# Patient Record
Sex: Female | Born: 1955 | Race: Black or African American | Hispanic: No | Marital: Single | Smoking: Never smoker
Health system: Southern US, Community
[De-identification: ages and names within clinical notes are randomized; demographics above are authoritative.]

## PROBLEM LIST (undated history)

## (undated) DIAGNOSIS — I10 Essential (primary) hypertension: Secondary | ICD-10-CM

## (undated) HISTORY — PX: ABDOMINAL HYSTERECTOMY: SHX81

## (undated) HISTORY — PX: TUBAL LIGATION: SHX77

---

## 2012-11-17 ENCOUNTER — Ambulatory Visit: Payer: Self-pay | Admitting: Family Medicine

## 2014-03-22 ENCOUNTER — Ambulatory Visit: Payer: Self-pay | Admitting: Internal Medicine

## 2014-05-03 ENCOUNTER — Ambulatory Visit: Payer: Self-pay

## 2015-09-16 ENCOUNTER — Encounter: Payer: Self-pay | Admitting: Emergency Medicine

## 2015-09-16 ENCOUNTER — Emergency Department: Payer: Self-pay

## 2015-09-16 ENCOUNTER — Emergency Department
Admission: EM | Admit: 2015-09-16 | Discharge: 2015-09-16 | Disposition: A | Discharge status: Home / Self Care | Payer: Self-pay | Attending: Emergency Medicine | Admitting: Emergency Medicine

## 2015-09-16 DIAGNOSIS — R197 Diarrhea, unspecified: Secondary | ICD-10-CM | POA: Insufficient documentation

## 2015-09-16 DIAGNOSIS — E860 Dehydration: Secondary | ICD-10-CM

## 2015-09-16 DIAGNOSIS — R109 Unspecified abdominal pain: Secondary | ICD-10-CM

## 2015-09-16 DIAGNOSIS — R1110 Vomiting, unspecified: Secondary | ICD-10-CM

## 2015-09-16 DIAGNOSIS — N390 Urinary tract infection, site not specified: Secondary | ICD-10-CM

## 2015-09-16 DIAGNOSIS — E876 Hypokalemia: Secondary | ICD-10-CM

## 2015-09-16 LAB — CBC
HEMATOCRIT: 38.3 % (ref 35.0–47.0)
Hemoglobin: 12.7 g/dL (ref 12.0–16.0)
MCH: 29.4 pg (ref 26.0–34.0)
MCHC: 33.1 g/dL (ref 32.0–36.0)
MCV: 89 fL (ref 80.0–100.0)
PLATELETS: 141 10*3/uL — AB (ref 150–440)
RBC: 4.3 MIL/uL (ref 3.80–5.20)
RDW: 14.5 % (ref 11.5–14.5)
WBC: 13.5 10*3/uL — AB (ref 3.6–11.0)

## 2015-09-16 LAB — COMPREHENSIVE METABOLIC PANEL
ALBUMIN: 4.6 g/dL (ref 3.5–5.0)
ALT: 52 U/L (ref 14–54)
ANION GAP: 8 (ref 5–15)
AST: 46 U/L — AB (ref 15–41)
Alkaline Phosphatase: 71 U/L (ref 38–126)
BILIRUBIN TOTAL: 0.6 mg/dL (ref 0.3–1.2)
BUN: 15 mg/dL (ref 6–20)
CHLORIDE: 105 mmol/L (ref 101–111)
CO2: 27 mmol/L (ref 22–32)
Calcium: 10.3 mg/dL (ref 8.9–10.3)
Creatinine, Ser: 0.72 mg/dL (ref 0.44–1.00)
GFR calc Af Amer: >60 mL/min (ref >60)
GFR calc non Af Amer: >60 mL/min (ref >60)
GLUCOSE: 120 mg/dL — AB (ref 65–99)
POTASSIUM: 2.7 mmol/L — AB (ref 3.5–5.1)
SODIUM: 140 mmol/L (ref 135–145)
TOTAL PROTEIN: 8.5 g/dL — AB (ref 6.5–8.1)

## 2015-09-16 LAB — URINALYSIS COMPLETE WITH MICROSCOPIC (ARMC ONLY)
BACTERIA UA: NONE SEEN
BILIRUBIN URINE: NEGATIVE
GLUCOSE, UA: NEGATIVE mg/dL
Ketones, ur: NEGATIVE mg/dL
LEUKOCYTES UA: NEGATIVE
NITRITE: NEGATIVE
Protein, ur: NEGATIVE mg/dL
SPECIFIC GRAVITY, URINE: 1.017 (ref 1.005–1.030)
pH: 8 (ref 5.0–8.0)

## 2015-09-16 LAB — LIPASE, BLOOD: LIPASE: 22 U/L (ref 11–51)

## 2015-09-16 MED ORDER — ONDANSETRON HCL 4 MG/2ML IJ SOLN
4.0000 mg | Freq: Once | INTRAMUSCULAR | Status: AC
  Administered 2015-09-16: 4 mg via INTRAVENOUS
  Filled 2015-09-16: qty 2

## 2015-09-16 MED ORDER — DIATRIZOATE MEGLUMINE & SODIUM 66-10 % PO SOLN
15.0000 mL | Freq: Once | ORAL | Status: AC
  Administered 2015-09-16: 15 mL via ORAL
  Filled 2015-09-16: qty 30

## 2015-09-16 MED ORDER — DEXTROSE 5 % IV SOLN
1.0000 g | Freq: Once | INTRAVENOUS | Status: AC
  Administered 2015-09-16: 1 g via INTRAVENOUS
  Filled 2015-09-16: qty 10

## 2015-09-16 MED ORDER — ONDANSETRON 4 MG PO TBDP: 4.0000 mg | ORAL_TABLET | Freq: Three times a day (TID) | ORAL | Status: DC | PRN

## 2015-09-16 MED ORDER — SODIUM CHLORIDE 0.45 % IV SOLN
INTRAVENOUS | Status: DC
  Administered 2015-09-16: 07:00:00 via INTRAVENOUS
  Filled 2015-09-16 (×4): qty 1000

## 2015-09-16 MED ORDER — DICYCLOMINE HCL 10 MG PO CAPS
20.0000 mg | ORAL_CAPSULE | Freq: Once | ORAL | Status: AC
  Administered 2015-09-16: 20 mg via ORAL
  Filled 2015-09-16: qty 2

## 2015-09-16 MED ORDER — MORPHINE SULFATE (PF) 4 MG/ML IV SOLN
4.0000 mg | Freq: Once | INTRAVENOUS | Status: AC
  Administered 2015-09-16: 4 mg via INTRAVENOUS
  Filled 2015-09-16: qty 1

## 2015-09-16 MED ORDER — POTASSIUM CHLORIDE CRYS ER 20 MEQ PO TBCR
40.0000 meq | EXTENDED_RELEASE_TABLET | Freq: Once | ORAL | Status: AC
  Administered 2015-09-16: 40 meq via ORAL
  Filled 2015-09-16: qty 2

## 2015-09-16 MED ORDER — POTASSIUM CHLORIDE 10 MEQ/100ML IV SOLN
10.0000 meq | Freq: Once | INTRAVENOUS | Status: AC
  Administered 2015-09-16: 10 meq via INTRAVENOUS
  Filled 2015-09-16: qty 100

## 2015-09-16 MED ORDER — IOPAMIDOL (ISOVUE-300) INJECTION 61%
100.0000 mL | Freq: Once | INTRAVENOUS | Status: AC | PRN
  Administered 2015-09-16: 100 mL via INTRAVENOUS

## 2015-09-16 MED ORDER — CIPROFLOXACIN HCL 500 MG PO TABS: 500.0000 mg | ORAL_TABLET | Freq: Two times a day (BID) | ORAL | Status: AC

## 2015-09-16 MED ORDER — POTASSIUM CHLORIDE 10 MEQ/100ML IV SOLN
10.0000 meq | Freq: Once | INTRAVENOUS | Status: DC
  Filled 2015-09-16: qty 100

## 2015-09-16 MED ORDER — POTASSIUM CHLORIDE CRYS ER 20 MEQ PO TBCR
40.0000 meq | EXTENDED_RELEASE_TABLET | Freq: Once | ORAL | Status: DC
  Filled 2015-09-16: qty 2

## 2015-09-16 MED ORDER — DICYCLOMINE HCL 20 MG PO TABS: 20.0000 mg | ORAL_TABLET | Freq: Three times a day (TID) | ORAL | Status: DC | PRN

## 2015-09-16 NOTE — ED Notes (Signed)
Pt verbalized understanding of discharge instructions. NAD at this time. 

## 2015-09-16 NOTE — ED Provider Notes (Signed)
[facility] Emergency Department Provider Note ___________________________________________  Time seen: Approximately 7:34 AM  I have reviewed the triage vital signs and the nursing notes.   HISTORY  Chief Complaint Emesis; Abdominal Pain; and Diarrhea  HPI Kimberly French is a 60 y.o. female complaining that last night she started having multiple episodes of vomiting and abdominal cramping. Patient states that by the time she got here this morning she also had some diarrhea. Since being in the ER patient is still having a moderate amount of cramping though the vomiting is somewhat improved. Patient states that at this time her pain on scale of 0-10 is an 8. Patient states that she had the flu at the end of March, but has not been exposed to anyone with vomiting or diarrhea. Patient denies any dysuria frequency or hematuria. She also denies any fever, chills, or rash. Patient denies any blood in her emesis or stools.   History reviewed. No pertinent past medical history.  There are no active problems to display for this patient.   Past Surgical History  Procedure Laterality Date  . Abdominal hysterectomy    . Tubal ligation      Current Outpatient Rx  Name  Route  Sig  Dispense  Refill  . ciprofloxacin (CIPRO) 500 MG tablet   Oral   Take 1 tablet (500 mg total) by mouth 2 (two) times daily.   14 tablet   0   . dicyclomine (BENTYL) 20 MG tablet   Oral   Take 1 tablet (20 mg total) by mouth 3 (three) times daily as needed for spasms.   12 tablet   0   . ondansetron (ZOFRAN ODT) 4 MG disintegrating tablet   Oral   Take 1 tablet (4 mg total) by mouth every 8 (eight) hours as needed for nausea or vomiting.   15 tablet   0     Allergies Review of patient's allergies indicates no known allergies.  No family history on file.  Social History Social History  Substance Use Topics  . Smoking status: Never Smoker   . Smokeless tobacco: None  .  Alcohol Use: No    Review of Systems Constitutional: No fever/chills Eyes: No visual changes. ENT: No sore throat. Cardiovascular: Denies chest pain. Respiratory: Denies shortness of breath. Gastrointestinal: Positive for abdominal pain primarily diffuse upper.  Positive for multiple episodes of vomiting and persistent nausea.  Positive for 1-2 episodes of diarrhea.  No constipation. Genitourinary: Negative for dysuria, frequency, or hematuria. Musculoskeletal: Negative for back pain. Skin: Negative for rash. Neurological: Negative for headaches, focal weakness or numbness.  10-point ROS otherwise negative.  ____________________________________________   PHYSICAL EXAM:  VITAL SIGNS: ED Triage Vitals  Enc Vitals Group     BP 09/16/15 0531 129/76 mmHg     Pulse Rate 09/16/15 0531 81     Resp 09/16/15 0531 18     Temp 09/16/15 0531 98.5 F (36.9 C)     Temp Source 09/16/15 0531 Oral     SpO2 09/16/15 0531 100 %     Weight 09/16/15 0531 153 lb (69.4 kg)     Height 09/16/15 0531 5\' 1"  (1.549 m)     Head Cir --      Peak Flow --      Pain Score 09/16/15 0534 10     Pain Loc --      Pain Edu? --      Excl. in GC? --     Constitutional:  Alert and oriented. Well appearing and inMild acute distress. Eyes: Conjunctivae are normal. PERRL. EOMI. Head: Atraumatic. Nose: No congestion/rhinnorhea. Mouth/Throat: Mucous membranes are moist.  Oropharynx non-erythematous. Neck: No stridor.   Cardiovascular: Normal rate, regular rhythm. Grossly normal heart sounds.  Good peripheral circulation. Respiratory: Normal respiratory effort.  No retractions. Lungs CTAB. Gastrointestinal: Soft and tender to palpation diffuse upper abdomen. No distention. No abdominal bruits. No CVA tenderness. Musculoskeletal: No lower extremity tenderness nor edema.  No joint effusions. Neurologic:  Normal speech and language. No gross focal neurologic deficits are appreciated. No gait instability. Skin:   Skin is warm, dry and intact. No rash noted. Psychiatric: Mood and affect are normal. Speech and behavior are normal.  ____________________________________________   LABS (all labs ordered are listed, but only abnormal results are displayed)  Labs Reviewed  COMPREHENSIVE METABOLIC PANEL - Abnormal; Notable for the following:    Potassium 2.7 (*)    Glucose, Bld 120 (*)    Total Protein 8.5 (*)    AST 46 (*)    All other components within normal limits  CBC - Abnormal; Notable for the following:    WBC 13.5 (*)    Platelets 141 (*)    All other components within normal limits  URINALYSIS COMPLETEWITH MICROSCOPIC ([facility] ONLY) - Abnormal; Notable for the following:    Color, Urine AMBER (*)    APPearance CLEAR (*)    Hgb urine dipstick 1+ (*)    Squamous Epithelial / LPF 0-5 (*)    All other components within normal limits  LIPASE, BLOOD   ____________________________________________  EKG   ____________________________________________  RADIOLOGY Ct Abdomen Pelvis W Contrast  09/16/2015  CLINICAL DATA:  Epigastric abdominal pain, cramping and vomiting. Diarrhea. EXAM: CT ABDOMEN AND PELVIS WITH CONTRAST TECHNIQUE: Multidetector CT imaging of the abdomen and pelvis was performed using the standard protocol following bolus administration of intravenous contrast. CONTRAST:  ISOVUE-300 IOPAMIDOL (ISOVUE-300) INJECTION 61% COMPARISON:  Right upper quadrant abdominal ultrasound dated 03/22/2014. FINDINGS: Lower chest: Mild linear atelectasis or scarring at both lung bases. Hepatobiliary: Small liver cysts.  Normal appearing gallbladder. Pancreas: No mass, inflammatory changes, or other significant abnormality. Spleen: Within normal limits in size and appearance. Adrenals/Urinary Tract: Tiny cyst in each kidney. The normal appearing ureters and urinary bladder. No urinary tract calculi or hydronephrosis. Normal appearing adrenal glands. Stomach/Bowel: Multiple descending and sigmoid  colon diverticula. Mild gastric distension. No small bowel dilatation. No evidence of appendicitis. Vascular/Lymphatic: Mild atheromatous arterial calcifications. No enlarged lymph nodes. Reproductive: Surgically absent uterus.  Normal appearing ovaries. Other: Tiny umbilical hernia containing fat. Musculoskeletal:  Lumbar spine degenerative changes. IMPRESSION: 1. Mild gastric distension. 2. Extensive sigmoid and descending colon diverticulosis without evidence of diverticulitis. Electronically Signed   By: Beckie Salts M.D.   On: 09/16/2015 09:49    ____________________________________________   PROCEDURES  Procedure(s) performed: None  Critical Care performed: No  ____________________________________________   INITIAL IMPRESSION / ASSESSMENT AND PLAN / ED COURSE  Pertinent labs & imaging results that were available during my care of the patient were reviewed by me and considered in my medical decision making (see chart for details). 10:56 AM At this time will give patient some morphine and Zofran for her pain. Patient already has IV fluids running in the emergency department. Patient will get CT abdomen pelvis to evaluate. Patient's urinalysis shows 6-30 WBCs, and patient will get some IV Rocephin. Patient's potassium was 2.7, and patient will get by mouth and IV potassium at this  time.  10:56 AM Patient is much improved after all of her medications. Patient will be given an additional 40 mEq of potassium to take at home this evening, but she was given 2 runs of 10 mEq IV as well as 40 by mouth in the ER. Patient also be given some Zofran and Bentyl for her vomiting and diarrhea. CT abdomen and pelvis was essentially without any acute findings. Patient was told to return immediately if condition worsens. Patient will also be placed on Cipro for her urinary tract infection. ____________________________________________   FINAL CLINICAL IMPRESSION(S) / ED DIAGNOSES  Final diagnoses:   Acute abdominal pain  Vomiting and diarrhea  Dehydration  Hypokalemia  UTI (lower urinary tract infection)      Leona Carry, MD 09/16/15 1056

## 2015-09-16 NOTE — ED Notes (Signed)
Pt presents to ED via [facility] with c/o epigastric abd cramping with vomiting 3-4X and diarrhea X1 since around 400 this morning. Denies urinary symptoms or similar symptoms previously.

## 2016-08-31 ENCOUNTER — Emergency Department: Payer: Self-pay

## 2016-08-31 ENCOUNTER — Encounter: Payer: Self-pay | Admitting: Emergency Medicine

## 2016-08-31 ENCOUNTER — Emergency Department
Admission: EM | Admit: 2016-08-31 | Discharge: 2016-08-31 | Disposition: A | Discharge status: Home / Self Care | Payer: Self-pay | Attending: Emergency Medicine | Admitting: Emergency Medicine

## 2016-08-31 DIAGNOSIS — J069 Acute upper respiratory infection, unspecified: Secondary | ICD-10-CM

## 2016-08-31 DIAGNOSIS — J101 Influenza due to other identified influenza virus with other respiratory manifestations: Secondary | ICD-10-CM | POA: Insufficient documentation

## 2016-08-31 DIAGNOSIS — N390 Urinary tract infection, site not specified: Secondary | ICD-10-CM | POA: Insufficient documentation

## 2016-08-31 LAB — COMPREHENSIVE METABOLIC PANEL
ALK PHOS: 78 U/L (ref 38–126)
ALT: 55 U/L — AB (ref 14–54)
AST: 46 U/L — ABNORMAL HIGH (ref 15–41)
Albumin: 4.4 g/dL (ref 3.5–5.0)
Anion gap: 9 (ref 5–15)
BUN: 16 mg/dL (ref 6–20)
CHLORIDE: 104 mmol/L (ref 101–111)
CO2: 23 mmol/L (ref 22–32)
CREATININE: 0.63 mg/dL (ref 0.44–1.00)
Calcium: 9.7 mg/dL (ref 8.9–10.3)
Glucose, Bld: 86 mg/dL (ref 65–99)
POTASSIUM: 3.6 mmol/L (ref 3.5–5.1)
Sodium: 136 mmol/L (ref 135–145)
Total Bilirubin: 0.8 mg/dL (ref 0.3–1.2)
Total Protein: 8.5 g/dL — ABNORMAL HIGH (ref 6.5–8.1)

## 2016-08-31 LAB — URINALYSIS, COMPLETE (UACMP) WITH MICROSCOPIC
BACTERIA UA: NONE SEEN
Bilirubin Urine: NEGATIVE
GLUCOSE, UA: NEGATIVE mg/dL
KETONES UR: NEGATIVE mg/dL
Nitrite: NEGATIVE
PROTEIN: NEGATIVE mg/dL
Specific Gravity, Urine: 1.001 — ABNORMAL LOW (ref 1.005–1.030)
pH: 7 (ref 5.0–8.0)

## 2016-08-31 LAB — CBC
HCT: 38.4 % (ref 35.0–47.0)
Hemoglobin: 12.7 g/dL (ref 12.0–16.0)
MCH: 29.2 pg (ref 26.0–34.0)
MCHC: 33 g/dL (ref 32.0–36.0)
MCV: 88.3 fL (ref 80.0–100.0)
PLATELETS: 130 10*3/uL — AB (ref 150–440)
RBC: 4.35 MIL/uL (ref 3.80–5.20)
RDW: 14.1 % (ref 11.5–14.5)
WBC: 5.6 10*3/uL (ref 3.6–11.0)

## 2016-08-31 LAB — TROPONIN I

## 2016-08-31 LAB — LIPASE, BLOOD: Lipase: 14 U/L (ref 11–51)

## 2016-08-31 LAB — INFLUENZA PANEL BY PCR (TYPE A & B)
INFLAPCR: NEGATIVE
Influenza B By PCR: POSITIVE — AB

## 2016-08-31 LAB — PROTIME-INR
INR: 0.93
PROTHROMBIN TIME: 12.5 s (ref 11.4–15.2)

## 2016-08-31 MED ORDER — IOPAMIDOL (ISOVUE-300) INJECTION 61%
100.0000 mL | Freq: Once | INTRAVENOUS | Status: AC | PRN
  Administered 2016-08-31: 100 mL via INTRAVENOUS

## 2016-08-31 MED ORDER — OSELTAMIVIR PHOSPHATE 75 MG PO CAPS: 75.0000 mg | ORAL_CAPSULE | Freq: Two times a day (BID) | ORAL | 0 refills | Status: AC

## 2016-08-31 MED ORDER — IOPAMIDOL (ISOVUE-300) INJECTION 61%
30.0000 mL | Freq: Once | INTRAVENOUS | Status: AC | PRN
  Administered 2016-08-31: 30 mL via ORAL

## 2016-08-31 MED ORDER — OSELTAMIVIR PHOSPHATE 75 MG PO CAPS
75.0000 mg | ORAL_CAPSULE | Freq: Once | ORAL | Status: AC
  Administered 2016-08-31: 75 mg via ORAL
  Filled 2016-08-31: qty 1

## 2016-08-31 MED ORDER — SODIUM CHLORIDE 0.9 % IV BOLUS (SEPSIS)
1000.0000 mL | Freq: Once | INTRAVENOUS | Status: AC
  Administered 2016-08-31: 1000 mL via INTRAVENOUS

## 2016-08-31 MED ORDER — ONDANSETRON HCL 4 MG/2ML IJ SOLN
4.0000 mg | Freq: Once | INTRAMUSCULAR | Status: AC
  Administered 2016-08-31: 4 mg via INTRAVENOUS
  Filled 2016-08-31: qty 2

## 2016-08-31 NOTE — ED Notes (Signed)
CT notified patient finished with contrast 

## 2016-08-31 NOTE — ED Provider Notes (Signed)
[facility] Emergency Department Provider Note  Time seen: 4:35 PM  I have reviewed the triage vital signs and the nursing notes.   HISTORY  Chief Complaint Hematemesis    HPI Kimberly French is a 61 y.o. female with no past medical history, takes no medications, presents to the emergency department for upper abdominal discomfort and hematemesis. According to the patient for the past 4 days she has been expressing upper abdominal discomfort along with nausea.  Patient also states mild congestion with low-grade temperature 100.3 at home. While at work today she had 2 or 3 episodes of vomiting which contained blood. Patient states the first time she vomited was dark red blood. Denies any retching before vomiting. Patient states her last bowel movement was approximately 4 days ago but continues to pass gas. Denies any recent black or bloody stool. Denies any history of GI bleeding. Denies any blood thinner use. Currently the patient appears well rates her upper abdominal pain as a 7/10. States mild nausea.  History reviewed. No pertinent past medical history.  There are no active problems to display for this patient.   Past Surgical History:  Procedure Laterality Date  . ABDOMINAL HYSTERECTOMY    . TUBAL LIGATION      Prior to Admission medications   Medication Sig Start Date End Date Taking? Authorizing Provider  dicyclomine (BENTYL) 20 MG tablet Take 1 tablet (20 mg total) by mouth 3 (three) times daily as needed for spasms. 09/16/15 09/15/16  Leona Carry, MD  ondansetron (ZOFRAN ODT) 4 MG disintegrating tablet Take 1 tablet (4 mg total) by mouth every 8 (eight) hours as needed for nausea or vomiting. 09/16/15   Leona Carry, MD    No Known Allergies  History reviewed. No pertinent family history.  Social History Social History  Substance Use Topics  . Smoking status: Never Smoker  . Smokeless tobacco: Not on file  . Alcohol use No    Review  of Systems Constitutional: Negative for fever. Cardiovascular: Negative for chest pain. Respiratory: Negative for shortness of breath. Gastrointestinal: Upper abdominal pain. Positive for nausea with bloody vomit. Negative for diarrhea. Positive for constipation 4 days. Genitourinary: Negative for dysuria. Neurological: Negative for headache 10-point ROS otherwise negative.  ____________________________________________   PHYSICAL EXAM:  Constitutional: Alert and oriented. Well appearing and in no distress. Eyes: Normal exam ENT   Head: Normocephalic and atraumatic.   Mouth/Throat: Mucous membranes are moist. Cardiovascular: Normal rate, regular rhythm. No murmur Respiratory: Normal respiratory effort without tachypnea nor retractions. Breath sounds are clear  Gastrointestinal: Soft, mild epigastric tenderness palpation along with right and left upper quadrant tenderness. No rebound or guarding. No area of significant focal tenderness. No distention. Musculoskeletal: Nontender with normal range of motion in all extremities. Neurologic:  Normal speech and language. No gross focal neurologic deficits Skin:  Skin is warm, dry and intact.  Psychiatric: Mood and affect are normal.   ____________________________________________    EKG  EKG reviewed and interpreted by myself shows normal sinus rhythm at 86 bpm, narrow QRS, normal axis, normal intervals, nonspecific ST changes without ST elevation.  ____________________________________________    RADIOLOGY  CT scan is largely negative.  ____________________________________________   INITIAL IMPRESSION / ASSESSMENT AND PLAN / ED COURSE  Pertinent labs & imaging results that were available during my care of the patient were reviewed by me and considered in my medical decision making (see chart for details).  The patient presents emergency department 4 days of  upper abdominal discomfort now with nausea and hematemesis. We  will check labs, dose Zofran IV fluids. We'll obtain a CT scan with contrast. Overall the patient appears well, no distress, nontoxic.  Patient's labs are overall reassuring. Normal H&H. Patient does have some nonspecific ST changes on her EKG. Given her nausea and vomiting with EKG abnormality I will add on a troponin.  CT scan is largely negative. I once again discussed the amount of blood that the patient vomited. She now states that she didn't vomit but that she was coughing and spit and what she describes as a large amount of blood she states was less than a quarter mixed with sputum. Has not spit up any blood since. States she has been coughing for the past 4 or 5 days with chills but denies fever. Given this new information I do not believe the patient necessarily requires a GI examination. We will obtain a chest x-ray and influenza swab. The patient he needs to appear well with no nausea or vomiting or further spitting up in the emergency department currently 96% on room air with a normal heart rate. Anticipate likely discharge home once chest x-ray and influenza swab result. Patient is agreeable to this plan.  Pt care signed out to Dr. Cyril Loosen. ____________________________________________   FINAL CLINICAL IMPRESSION(S) / ED DIAGNOSES  Upper abdominal pain Hemoptysis Cough    Minna Antis, MD 08/31/16 2048

## 2016-08-31 NOTE — ED Triage Notes (Signed)
Patient from work ([employer]) with an acute onset of hematemesis. Patient has had nausea since Thursday with fatigue, generalized weakness, and body aches. Patient states that initial emesis was dark red with subsequent episodes being lighter.

## 2016-08-31 NOTE — ED Provider Notes (Signed)
Patient + for influenza B. Otherwise workup is reassuring. Will give tamiflu dose in ED and discharge with outpatient followup. Patient and family are comfortable with this plan.   Jene Every, MD 08/31/16 2242

## 2016-10-20 ENCOUNTER — Encounter: Payer: Self-pay | Admitting: Emergency Medicine

## 2016-10-20 ENCOUNTER — Emergency Department
Admission: EM | Admit: 2016-10-20 | Discharge: 2016-10-20 | Disposition: A | Discharge status: Home / Self Care | Payer: Self-pay | Attending: Emergency Medicine | Admitting: Emergency Medicine

## 2016-10-20 DIAGNOSIS — X509XXA Other and unspecified overexertion or strenuous movements or postures, initial encounter: Secondary | ICD-10-CM | POA: Insufficient documentation

## 2016-10-20 DIAGNOSIS — Y929 Unspecified place or not applicable: Secondary | ICD-10-CM | POA: Insufficient documentation

## 2016-10-20 DIAGNOSIS — Y9389 Activity, other specified: Secondary | ICD-10-CM | POA: Insufficient documentation

## 2016-10-20 DIAGNOSIS — S29012A Strain of muscle and tendon of back wall of thorax, initial encounter: Secondary | ICD-10-CM | POA: Insufficient documentation

## 2016-10-20 DIAGNOSIS — S29019A Strain of muscle and tendon of unspecified wall of thorax, initial encounter: Secondary | ICD-10-CM

## 2016-10-20 DIAGNOSIS — Y990 Civilian activity done for income or pay: Secondary | ICD-10-CM | POA: Insufficient documentation

## 2016-10-20 MED ORDER — NAPROXEN 500 MG PO TABS: 500.0000 mg | ORAL_TABLET | Freq: Two times a day (BID) | ORAL | 0 refills | Status: AC

## 2016-10-20 MED ORDER — CYCLOBENZAPRINE HCL 5 MG PO TABS: 5.0000 mg | ORAL_TABLET | Freq: Three times a day (TID) | ORAL | 0 refills | Status: AC | PRN

## 2016-10-20 NOTE — ED Triage Notes (Signed)
States she was pulling a large trash can at work  And felt pain to right lower back  Ambulates well to treatment area

## 2016-10-20 NOTE — ED Provider Notes (Signed)
[facility] Emergency Department Provider Note  ____________________________________________  Time seen: Approximately 1:26 PM  I have reviewed the triage vital signs and the nursing notes.   HISTORY  Chief Complaint Back Pain    HPI Kimberly French is a 61 y.o. female presents to emergency department with low left sided back pain for one day. Patient states that she was taking out the trash at work yesterday when she felt something pull in her back. She is here because she is concerned that she has a pulled muscle. She put ice on it last night, which seemed to help. Pain worsened this morning and she took Aleve.She has been walking but with pain in her lower back. Movements and bending forward makes the pain worse. She denies shortness of breath, chest pain, nausea, vomiting, bowel or bladder dysfunction, dysuria, urgency, frequency, saddle paresthesias, numbness, tingling.   History reviewed. No pertinent past medical history.  There are no active problems to display for this patient.   Past Surgical History:  Procedure Laterality Date  . ABDOMINAL HYSTERECTOMY    . TUBAL LIGATION      Prior to Admission medications   Medication Sig Start Date End Date Taking? Authorizing Provider  cyclobenzaprine (FLEXERIL) 5 MG tablet Take 1 tablet (5 mg total) by mouth 3 (three) times daily as needed for muscle spasms. 10/20/16 10/27/16  Enid DerryWagner, Arav Bannister, PA-C  dicyclomine (BENTYL) 20 MG tablet Take 1 tablet (20 mg total) by mouth 3 (three) times daily as needed for spasms. 09/16/15 09/15/16  Leona Carryaylor, Linda M, MD  naproxen (NAPROSYN) 500 MG tablet Take 1 tablet (500 mg total) by mouth 2 (two) times daily with a meal. 10/20/16 10/20/17  Enid DerryWagner, Rook Maue, PA-C  ondansetron (ZOFRAN ODT) 4 MG disintegrating tablet Take 1 tablet (4 mg total) by mouth every 8 (eight) hours as needed for nausea or vomiting. 09/16/15   Leona Carryaylor, Linda M, MD    Allergies Patient has no known  allergies.  No family history on file.  Social History Social History  Substance Use Topics  . Smoking status: Never Smoker  . Smokeless tobacco: Never Used  . Alcohol use No     Review of Systems  Constitutional: No fever/chills Cardiovascular: No chest pain. Respiratory: No SOB. Gastrointestinal: No abdominal pain.  No nausea, no vomiting.  Genitourinary: Negative for dysuria, urgency, frequency. Musculoskeletal: Positive for back pain. Skin: Negative for rash, abrasions, lacerations, ecchymosis. Neurological: Negative for headaches, numbness or tingling   ____________________________________________   PHYSICAL EXAM:  VITAL SIGNS: ED Triage Vitals  Enc Vitals Group     BP 10/20/16 1248 (!) 138/92     Pulse Rate 10/20/16 1248 79     Resp 10/20/16 1248 20     Temp 10/20/16 1248 98.7 F (37.1 C)     Temp Source 10/20/16 1248 Oral     SpO2 10/20/16 1248 99 %     Weight 10/20/16 1249 152 lb (68.9 kg)     Height 10/20/16 1249 5\' 1"  (1.549 m)     Head Circumference --      Peak Flow --      Pain Score 10/20/16 1248 6     Pain Loc --      Pain Edu? --      Excl. in GC? --      Constitutional: Alert and oriented. Well appearing and in no acute distress. Eyes: Conjunctivae are normal. PERRL. EOMI. Head: Atraumatic. ENT:      Ears:  Nose: No congestion/rhinnorhea.      Mouth/Throat: Mucous membranes are moist.  Neck: No stridor.   Cardiovascular: Normal rate, regular rhythm.  Good peripheral circulation. Respiratory: Normal respiratory effort without tachypnea or retractions. Lungs CTAB. Good air entry to the bases with no decreased or absent breath sounds. Gastrointestinal: Bowel sounds 4 quadrants. Soft and nontender to palpation. Musculoskeletal: Full range of motion to all extremities. No gross deformities appreciated. Tenderness to palpation over thoracic muscles. No tenderness to palpation over thoracic or lumbar spine. Neurologic:  Normal speech and  language. No gross focal neurologic deficits are appreciated.  Skin:  Skin is warm, dry and intact. No rash noted.   ____________________________________________   LABS (all labs ordered are listed, but only abnormal results are displayed)  Labs Reviewed - No data to display ____________________________________________  EKG   ____________________________________________  RADIOLOGY  No results found.  ____________________________________________    PROCEDURES  Procedure(s) performed:    Procedures    Medications - No data to display   ____________________________________________   INITIAL IMPRESSION / ASSESSMENT AND PLAN / ED COURSE  Pertinent labs & imaging results that were available during my care of the patient were reviewed by me and considered in my medical decision making (see chart for details).  Review of the [place] CSRS was performed in accordance of the [facility] prior to dispensing any controlled drugs.  Patient's diagnosis is consistent with back strain. Vital signs and exam are reassuring. No trauma or midline tenderness so I do not think there is indication for imaging at this time. Patient will be discharged home with prescriptions for flexeril and naprosyn. Work note was provided for light duty for 1 week. Patient is to follow up with PCP as directed. Patient is given ED precautions to return to the ED for any worsening or new symptoms.     ____________________________________________  FINAL CLINICAL IMPRESSION(S) / ED DIAGNOSES  Final diagnoses:  Strain of thoracic region, initial encounter      NEW MEDICATIONS STARTED DURING THIS VISIT:  Discharge Medication List as of 10/20/2016  2:03 PM    START taking these medications   Details  cyclobenzaprine (FLEXERIL) 5 MG tablet Take 1 tablet (5 mg total) by mouth 3 (three) times daily as needed for muscle spasms., Starting Mon 10/20/2016, Until Mon 10/27/2016, Print    naproxen (NAPROSYN) 500 MG  tablet Take 1 tablet (500 mg total) by mouth 2 (two) times daily with a meal., Starting Mon 10/20/2016, Until Tue 10/20/2017, Print            This chart was dictated using voice recognition software/Dragon. Despite best efforts to proofread, errors can occur which can change the meaning. Any change was purely unintentional.    Enid Derry, PA-C 10/20/16 1525    Jene Every, MD 10/20/16 [phone]

## 2016-10-20 NOTE — ED Notes (Signed)
Urine drug screen performed. 

## 2016-11-06 ENCOUNTER — Emergency Department
Admission: EM | Admit: 2016-11-06 | Discharge: 2016-11-06 | Disposition: A | Discharge status: Home / Self Care | Payer: Self-pay | Attending: Emergency Medicine | Admitting: Emergency Medicine

## 2016-11-06 ENCOUNTER — Emergency Department: Payer: Self-pay

## 2016-11-06 ENCOUNTER — Encounter: Payer: Self-pay | Admitting: *Deleted

## 2016-11-06 DIAGNOSIS — Y939 Activity, unspecified: Secondary | ICD-10-CM | POA: Insufficient documentation

## 2016-11-06 DIAGNOSIS — Y999 Unspecified external cause status: Secondary | ICD-10-CM | POA: Insufficient documentation

## 2016-11-06 DIAGNOSIS — N3000 Acute cystitis without hematuria: Secondary | ICD-10-CM | POA: Insufficient documentation

## 2016-11-06 DIAGNOSIS — M47896 Other spondylosis, lumbar region: Secondary | ICD-10-CM | POA: Insufficient documentation

## 2016-11-06 DIAGNOSIS — N3090 Cystitis, unspecified without hematuria: Secondary | ICD-10-CM

## 2016-11-06 DIAGNOSIS — Y929 Unspecified place or not applicable: Secondary | ICD-10-CM | POA: Insufficient documentation

## 2016-11-06 DIAGNOSIS — S39012A Strain of muscle, fascia and tendon of lower back, initial encounter: Secondary | ICD-10-CM | POA: Insufficient documentation

## 2016-11-06 DIAGNOSIS — M47816 Spondylosis without myelopathy or radiculopathy, lumbar region: Secondary | ICD-10-CM

## 2016-11-06 DIAGNOSIS — X509XXA Other and unspecified overexertion or strenuous movements or postures, initial encounter: Secondary | ICD-10-CM | POA: Insufficient documentation

## 2016-11-06 LAB — URINALYSIS, COMPLETE (UACMP) WITH MICROSCOPIC
BACTERIA UA: NONE SEEN
BILIRUBIN URINE: NEGATIVE
Glucose, UA: NEGATIVE mg/dL
KETONES UR: 5 mg/dL — AB
Nitrite: NEGATIVE
PROTEIN: 30 mg/dL — AB
Specific Gravity, Urine: 1.026 (ref 1.005–1.030)
pH: 7 (ref 5.0–8.0)

## 2016-11-06 MED ORDER — BACLOFEN 10 MG PO TABS
5.0000 mg | ORAL_TABLET | Freq: Once | ORAL | Status: AC
  Administered 2016-11-06: 5 mg via ORAL
  Filled 2016-11-06: qty 1

## 2016-11-06 MED ORDER — BACLOFEN 10 MG PO TABS: 5.0000 mg | ORAL_TABLET | Freq: Every day | ORAL | 0 refills | Status: AC

## 2016-11-06 MED ORDER — KETOROLAC TROMETHAMINE 60 MG/2ML IM SOLN
30.0000 mg | Freq: Once | INTRAMUSCULAR | Status: AC
  Administered 2016-11-06: 30 mg via INTRAMUSCULAR
  Filled 2016-11-06: qty 2

## 2016-11-06 MED ORDER — LIDOCAINE 5 % EX PTCH
1.0000 | MEDICATED_PATCH | CUTANEOUS | Status: DC
  Administered 2016-11-06: 1 via TRANSDERMAL
  Filled 2016-11-06: qty 1

## 2016-11-06 MED ORDER — CEPHALEXIN 500 MG PO CAPS: 500.0000 mg | ORAL_CAPSULE | Freq: Two times a day (BID) | ORAL | 0 refills | Status: AC

## 2016-11-06 NOTE — ED Triage Notes (Signed)
States lower back pain and spasms, states she was recently seen for the same and given flexeril, awake and alert

## 2016-11-06 NOTE — ED Notes (Signed)
Pt reports thinks she pulled a muscle in her right lower back. Pt reports was seen for this a couple of weeks ago as a WC injury. Reported had all testing completed. Pt states was feeling better and the pain started back Tuesday night and has not gotten better. Pt reports pain is constant. Pt states flexeril is not helping. Denies all other symptoms.

## 2016-11-06 NOTE — ED Provider Notes (Signed)
[facility] Emergency Department Provider Note  ____________________________________________  Time seen: Approximately 11:03 AM  I have reviewed the triage vital signs and the nursing notes.   HISTORY  Chief Complaint Back Pain    HPI Kimberly French is a 61 y.o. female that presents to emergency department with low back pain. Patient states that she was evaluated 2 weeks ago for similar symptoms. She was taking Aleve and a muscle relaxer which resolved symptoms for a week. Pain began hurting again 2 days ago. She continued to work and pain has continued to get worse. Pain is primarily in her low back on the right side.Pain does not radiate. She lifts patients at work all day. She took a muscle relaxer this morning but it did not help. She denies headache, fever, shortness of breath, chest pain, nausea, vomiting, abdominal pain, bowel or bladder dysfunction, saddle paresthesias, numbness, tingling, dysuria, urgency, frequency, leg pain.   History reviewed. No pertinent past medical history.  There are no active problems to display for this patient.   Past Surgical History:  Procedure Laterality Date  . ABDOMINAL HYSTERECTOMY    . TUBAL LIGATION      Prior to Admission medications   Medication Sig Start Date End Date Taking? Authorizing Provider  baclofen (LIORESAL) 10 MG tablet Take 0.5 tablets (5 mg total) by mouth daily. 11/06/16 11/06/17  Enid Derry, PA-C  cephALEXin (KEFLEX) 500 MG capsule Take 1 capsule (500 mg total) by mouth 2 (two) times daily. 11/06/16 11/16/16  Enid Derry, PA-C  dicyclomine (BENTYL) 20 MG tablet Take 1 tablet (20 mg total) by mouth 3 (three) times daily as needed for spasms. 09/16/15 09/15/16  Leona Carry, MD  naproxen (NAPROSYN) 500 MG tablet Take 1 tablet (500 mg total) by mouth 2 (two) times daily with a meal. 10/20/16 10/20/17  Enid Derry, PA-C  ondansetron (ZOFRAN ODT) 4 MG disintegrating tablet Take 1 tablet (4 mg  total) by mouth every 8 (eight) hours as needed for nausea or vomiting. 09/16/15   Leona Carry, MD    Allergies Patient has no known allergies.  History reviewed. No pertinent family history.  Social History Social History  Substance Use Topics  . Smoking status: Never Smoker  . Smokeless tobacco: Never Used  . Alcohol use No     Review of Systems  Constitutional: No fever/chills Cardiovascular: No chest pain. Respiratory:  No SOB. Gastrointestinal: No abdominal pain.  No nausea, no vomiting.  Musculoskeletal: Positive for low back pain. Skin: Negative for rash, abrasions, lacerations, ecchymosis. Neurological: Negative for headaches, numbness or tingling   ____________________________________________   PHYSICAL EXAM:  VITAL SIGNS: ED Triage Vitals  Enc Vitals Group     BP 11/06/16 0951 123/79     Pulse Rate 11/06/16 0951 90     Resp 11/06/16 0951 18     Temp 11/06/16 0951 97.8 F (36.6 C)     Temp Source 11/06/16 0951 Oral     SpO2 11/06/16 0951 98 %     Weight 11/06/16 0947 152 lb (68.9 kg)     Height 11/06/16 0947 5\' 1"  (1.549 m)     Head Circumference --      Peak Flow --      Pain Score 11/06/16 0947 10     Pain Loc --      Pain Edu? --      Excl. in GC? --      Constitutional: Alert and oriented. Well appearing and in  no acute distress. Eyes: Conjunctivae are normal. PERRL. EOMI. Head: Atraumatic. ENT:      Ears:      Nose: No congestion/rhinnorhea.      Mouth/Throat: Mucous membranes are moist.  Neck: No stridor.  Cardiovascular: Normal rate, regular rhythm.  Good peripheral circulation. Respiratory: Normal respiratory effort without tachypnea or retractions. Lungs CTAB. Good air entry to the bases with no decreased or absent breath sounds. Gastrointestinal: Bowel sounds 4 quadrants. Soft and nontender to palpation. No guarding or rigidity. No palpable masses. No distention. No CVA tenderness.  Musculoskeletal: Full range of motion to all  extremities. No gross deformities appreciated. Tenderness to palpation over thoracic and midline spine. Tenderness to palpation over right lumbar muscles.  Neurologic:  Normal speech and language. No gross focal neurologic deficits are appreciated.  Skin:  Skin is warm, dry and intact. No rash noted.  ____________________________________________   LABS (all labs ordered are listed, but only abnormal results are displayed)  Labs Reviewed  URINALYSIS, COMPLETE (UACMP) WITH MICROSCOPIC - Abnormal; Notable for the following:       Result Value   Color, Urine YELLOW (*)    APPearance CLOUDY (*)    Hgb urine dipstick SMALL (*)    Ketones, ur 5 (*)    Protein, ur 30 (*)    Leukocytes, UA LARGE (*)    Squamous Epithelial / LPF 6-30 (*)    All other components within normal limits   ____________________________________________  EKG   ____________________________________________  RADIOLOGY Lexine Baton, personally viewed and evaluated these images (plain radiographs) as part of my medical decision making, as well as reviewing the written report by the radiologist.  Dg Thoracic Spine 2 View  Result Date: 11/06/2016 CLINICAL DATA:  Low back pain.  No known injury. EXAM: THORACIC SPINE 2 VIEWS COMPARISON:  None. FINDINGS: Normal alignment. No fracture. Early spurring in the mid and lower thoracic spine. IMPRESSION: No acute bony abnormality. Electronically Signed   By: Charlett Nose M.D.   On: 11/06/2016 11:18   Dg Lumbar Spine Complete  Result Date: 11/06/2016 CLINICAL DATA:  Low back pain, onset 2 days ago.  No known injury. EXAM: LUMBAR SPINE - COMPLETE 4+ VIEW COMPARISON:  CT 08/31/2016 FINDINGS: Degenerative facet disease throughout the lumbar spine. Disc spaces are maintained. No fracture or subluxation. SI joints are symmetric and unremarkable. Scattered aortic calcifications. IMPRESSION: Degenerative facet disease diffusely throughout the lumbar spine. No acute bony abnormality.  Electronically Signed   By: Charlett Nose M.D.   On: 11/06/2016 11:17   Ct Renal Stone Study  Result Date: 11/06/2016 CLINICAL DATA:  RIGHT side low back pain, initially thought to be up old muscle but not getting better, constant pain since Tuesday night, no relief with Flexeril EXAM: CT ABDOMEN AND PELVIS WITHOUT CONTRAST TECHNIQUE: Multidetector CT imaging of the abdomen and pelvis was performed following the standard protocol without IV contrast. Sagittal and coronal MPR images reconstructed from axial data set. Oral contrast not administered for this indication. COMPARISON:  08/31/2016 FINDINGS: Lower chest: Minimal linear subsegmental atelectasis at both lung bases. Hepatobiliary: Gallbladder and liver normal appearance Pancreas: Normal appearance Spleen: Normal appearance Adrenals/Urinary Tract: Adrenal glands normal appearance. Kidneys, ureters, and bladder normal appearance for technique. No urinary tract calcification or dilatation. Stomach/Bowel: Normal appendix. Diverticulosis of descending and sigmoid colon without evidence of diverticulitis. Small hiatal hernia. Stomach and bowel loops otherwise normal appearance. Vascular/Lymphatic: Atherosclerotic calcification aorta without aneurysm. Circumaortic LEFT renal vein. Few pelvic phleboliths. No adenopathy. Reproductive:  Uterus surgically absent.  Ovaries unremarkable. Other: No free air or free fluid. Tiny umbilical hernia containing fat. Musculoskeletal: Mild degenerative disc and facet disease changes of the lumbar spine. IMPRESSION: No evidence of urinary tract calcification or dilatation. Distal colonic diverticulosis without evidence of diverticulitis. Tiny umbilical hernia containing fat. Aortic atherosclerosis. Electronically Signed   By: Ulyses SouthwardMark  Boles M.D.   On: 11/06/2016 12:53    ____________________________________________    PROCEDURES  Procedure(s) performed:    Procedures    Medications  lidocaine (LIDODERM) 5 % 1 patch (1  patch Transdermal Patch Applied 11/06/16 1114)  ketorolac (TORADOL) injection 30 mg (30 mg Intramuscular Given 11/06/16 1141)  baclofen (LIORESAL) tablet 5 mg (5 mg Oral Given 11/06/16 1345)     ____________________________________________   INITIAL IMPRESSION / ASSESSMENT AND PLAN / ED COURSE  Pertinent labs & imaging results that were available during my care of the patient were reviewed by me and considered in my medical decision making (see chart for details).  Review of the [place] CSRS was performed in accordance of the [facility] prior to dispensing any controlled drugs.   Patient's diagnosis is consistent with cystitis, lumbar strain, osteoarthritis. Vital signs and exam are reassuring. No acute bony abnormalities of thoracic or lumbar x-ray. Lumbar x-ray indicates osteoarthritis. Urinalysis shows leukocytes so CT renal stone study was ordered. No indication of kidney stone on CT. No abdominal pain. Patient states that she felt significantly better after Toradol shot. Patient will be discharged home with prescriptions for Keflex and baclofen. Patient is to follow up with PCP as directed. Patient is given ED precautions to return to the ED for any worsening or new symptoms.     ____________________________________________  FINAL CLINICAL IMPRESSION(S) / ED DIAGNOSES  Final diagnoses:  Cystitis  Strain of lumbar region, initial encounter  Spondylosis of lumbar region without myelopathy or radiculopathy      NEW MEDICATIONS STARTED DURING THIS VISIT:  Discharge Medication List as of 11/06/2016  1:33 PM    START taking these medications   Details  baclofen (LIORESAL) 10 MG tablet Take 0.5 tablets (5 mg total) by mouth daily., Starting Thu 11/06/2016, Until Fri 11/06/2017, Print    cephALEXin (KEFLEX) 500 MG capsule Take 1 capsule (500 mg total) by mouth 2 (two) times daily., Starting Thu 11/06/2016, Until Sun 11/16/2016, Print            This chart was dictated using voice recognition  software/Dragon. Despite best efforts to proofread, errors can occur which can change the meaning. Any change was purely unintentional.    Enid DerryWagner, Chalisa Kobler, PA-C 11/06/16 1442    Sharman CheekStafford, Phillip, MD 11/08/16 2022

## 2017-06-14 ENCOUNTER — Other Ambulatory Visit: Payer: Self-pay

## 2017-06-14 ENCOUNTER — Emergency Department
Admission: EM | Admit: 2017-06-14 | Discharge: 2017-06-14 | Disposition: A | Discharge status: Home / Self Care | Payer: Self-pay | Attending: Emergency Medicine | Admitting: Emergency Medicine

## 2017-06-14 ENCOUNTER — Encounter: Payer: Self-pay | Admitting: Emergency Medicine

## 2017-06-14 DIAGNOSIS — Z79899 Other long term (current) drug therapy: Secondary | ICD-10-CM | POA: Insufficient documentation

## 2017-06-14 DIAGNOSIS — J069 Acute upper respiratory infection, unspecified: Secondary | ICD-10-CM | POA: Insufficient documentation

## 2017-06-14 MED ORDER — PREDNISONE 10 MG PO TABS: ORAL_TABLET | ORAL | 0 refills | Status: DC

## 2017-06-14 MED ORDER — IPRATROPIUM-ALBUTEROL 0.5-2.5 (3) MG/3ML IN SOLN
3.0000 mL | Freq: Once | RESPIRATORY_TRACT | Status: AC
  Administered 2017-06-14: 3 mL via RESPIRATORY_TRACT
  Filled 2017-06-14: qty 3

## 2017-06-14 MED ORDER — BENZONATATE 100 MG PO CAPS: 100.0000 mg | ORAL_CAPSULE | Freq: Three times a day (TID) | ORAL | 0 refills | Status: AC | PRN

## 2017-06-14 MED ORDER — ALBUTEROL SULFATE HFA 108 (90 BASE) MCG/ACT IN AERS: 2.0000 | INHALATION_SPRAY | Freq: Four times a day (QID) | RESPIRATORY_TRACT | 0 refills | Status: AC | PRN

## 2017-06-14 MED ORDER — AZITHROMYCIN 250 MG PO TABS: ORAL_TABLET | ORAL | 0 refills | Status: DC

## 2017-06-14 NOTE — ED Provider Notes (Signed)
[facility] Emergency Department Provider Note  ____________________________________________  Time seen: Approximately 9:13 AM  I have reviewed the triage vital signs and the nursing notes.   HISTORY  Chief Complaint Cough and Sore Throat    HPI Kimberly French is a 62 y.o. female that presents to the emergency department for evaluation of chest congestion and productive cough with clear phelem for 5 days.  Cough was originally keeping patient up at night but this has been improving.  She does not think she has had a fever but has had chills.  She does not smoke.  No history of allergies or asthma.  Several family members have bronchitis.  No nasal congestion, sore throat, shortness of breath, chest pain, nausea, vomiting, abdominal pain.  History reviewed. No pertinent past medical history.  There are no active problems to display for this patient.   Past Surgical History:  Procedure Laterality Date  . ABDOMINAL HYSTERECTOMY    . TUBAL LIGATION      Prior to Admission medications   Medication Sig Start Date End Date Taking? Authorizing Provider  albuterol (PROVENTIL HFA;VENTOLIN HFA) 108 (90 Base) MCG/ACT inhaler Inhale 2 puffs into the lungs every 6 (six) hours as needed for wheezing or shortness of breath. 06/14/17   Enid DerryWagner, Kedarius Aloisi, PA-C  azithromycin (ZITHROMAX Z-PAK) 250 MG tablet Take 2 tablets (500 mg) on  Day 1,  followed by 1 tablet (250 mg) once daily on Days 2 through 5. 06/14/17   Enid DerryWagner, Niaomi Cartaya, PA-C  baclofen (LIORESAL) 10 MG tablet Take 0.5 tablets (5 mg total) by mouth daily. 11/06/16 11/06/17  Enid DerryWagner, Reyce Lubeck, PA-C  benzonatate (TESSALON PERLES) 100 MG capsule Take 1 capsule (100 mg total) by mouth 3 (three) times daily as needed for cough. 06/14/17 06/14/18  Enid DerryWagner, Ayris Carano, PA-C  dicyclomine (BENTYL) 20 MG tablet Take 1 tablet (20 mg total) by mouth 3 (three) times daily as needed for spasms. 09/16/15 09/15/16  Leona Carryaylor, Linda M, MD  naproxen  (NAPROSYN) 500 MG tablet Take 1 tablet (500 mg total) by mouth 2 (two) times daily with a meal. 10/20/16 10/20/17  Enid DerryWagner, Joshua Zeringue, PA-C  ondansetron (ZOFRAN ODT) 4 MG disintegrating tablet Take 1 tablet (4 mg total) by mouth every 8 (eight) hours as needed for nausea or vomiting. 09/16/15   Leona Carryaylor, Linda M, MD  predniSONE (DELTASONE) 10 MG tablet Take 6 tablets on day 1, take 5 tablets on day 2, take 4 tablets on day 3, take 3 tablets on day 4, take 2 tablets on day 5, take 1 tablet on day 6 06/14/17   Enid DerryWagner, Hutson Luft, PA-C    Allergies Patient has no known allergies.  No family history on file.  Social History Social History   Tobacco Use  . Smoking status: Never Smoker  . Smokeless tobacco: Never Used  Substance Use Topics  . Alcohol use: No  . Drug use: No     Review of Systems  Constitutional: No fever/chills Eyes: No visual changes. No discharge. ENT: Positive for congestion and rhinorrhea. Cardiovascular: No chest pain. Respiratory: Positive for cough. No SOB. Gastrointestinal: No abdominal pain.  No nausea, no vomiting.  No diarrhea.  No constipation. Musculoskeletal: Negative for musculoskeletal pain. Skin: Negative for rash, abrasions, lacerations, ecchymosis.   ____________________________________________   PHYSICAL EXAM:  VITAL SIGNS: ED Triage Vitals  Enc Vitals Group     BP 06/14/17 0904 130/83     Pulse Rate 06/14/17 0904 81     Resp 06/14/17 0904 18  Temp 06/14/17 0904 98.3 F (36.8 C)     Temp Source 06/14/17 0904 Oral     SpO2 06/14/17 0904 98 %     Weight 06/14/17 0902 154 lb (69.9 kg)     Height 06/14/17 0902 5\' 1"  (1.549 m)     Head Circumference --      Peak Flow --      Pain Score 06/14/17 0902 10     Pain Loc --      Pain Edu? --      Excl. in GC? --      Constitutional: Alert and oriented. Well appearing and in no acute distress. Eyes: Conjunctivae are normal. PERRL. EOMI. No discharge. Head: Atraumatic. ENT: No frontal and maxillary  sinus tenderness.      Ears: Tympanic membranes pearly gray with good landmarks. No discharge.      Nose: Mild congestion/rhinnorhea.      Mouth/Throat: Mucous membranes are moist. Oropharynx non-erythematous. Tonsils not enlarged. No exudates. Uvula midline. Neck: No stridor.   Hematological/Lymphatic/Immunilogical: No cervical lymphadenopathy. Cardiovascular: Normal rate, regular rhythm.  Good peripheral circulation. Respiratory: Normal respiratory effort without tachypnea or retractions. Lungs CTAB. Good air entry to the bases with no decreased or absent breath sounds. Gastrointestinal: Bowel sounds 4 quadrants. Soft and nontender to palpation. No guarding or rigidity. No palpable masses. No distention. Musculoskeletal: Full range of motion to all extremities. No gross deformities appreciated. Neurologic:  Normal speech and language. No gross focal neurologic deficits are appreciated.  Skin:  Skin is warm, dry and intact. No rash noted.    ____________________________________________   LABS (all labs ordered are listed, but only abnormal results are displayed)  Labs Reviewed - No data to display ____________________________________________  EKG   ____________________________________________  RADIOLOGY   No results found.  ____________________________________________    PROCEDURES  Procedure(s) performed:    Procedures    Medications  ipratropium-albuterol (DUONEB) 0.5-2.5 (3) MG/3ML nebulizer solution 3 mL (3 mLs Nebulization Given 06/14/17 0932)     ____________________________________________   INITIAL IMPRESSION / ASSESSMENT AND PLAN / ED COURSE  Pertinent labs & imaging results that were available during my care of the patient were reviewed by me and considered in my medical decision making (see chart for details).  Review of the [place] CSRS was performed in accordance of the [facility] prior to dispensing any controlled drugs.   Patient's diagnosis is  consistent with URI with cough. Vital signs and exam are reassuring.  We discussed doing a chest x-ray and agreed it would unlikely change management at this time.  Patient appears well and is staying well hydrated. Patient should alternate tylenol and ibuprofen for fever. Patient feels comfortable going home. She was given a Duoneb. Patient will be discharged home with prescriptions for azithromycin, prednisone, albuterol inhaler, tessalon pearles. Patient is to follow up with PCP as needed or otherwise directed. Patient is given ED precautions to return to the ED for any worsening or new symptoms.     ____________________________________________  FINAL CLINICAL IMPRESSION(S) / ED DIAGNOSES  Final diagnoses:  URI with cough and congestion      NEW MEDICATIONS STARTED DURING THIS VISIT:  ED Discharge Orders        Ordered    azithromycin (ZITHROMAX Z-PAK) 250 MG tablet     06/14/17 0945    predniSONE (DELTASONE) 10 MG tablet     06/14/17 0945    benzonatate (TESSALON PERLES) 100 MG capsule  3 times daily PRN  06/14/17 0945    albuterol (PROVENTIL HFA;VENTOLIN HFA) 108 (90 Base) MCG/ACT inhaler  Every 6 hours PRN     06/14/17 0945          This chart was dictated using voice recognition software/Dragon. Despite best efforts to proofread, errors can occur which can change the meaning. Any change was purely unintentional.    Enid Derry, PA-C 06/14/17 1115    Dionne Bucy, MD 06/14/17 [phone]

## 2017-06-14 NOTE — ED Triage Notes (Signed)
Pt c/o cough, sore throat, and congestion since last Tuesday. Pt in NAD at this time.

## 2017-09-21 ENCOUNTER — Emergency Department
Admission: EM | Admit: 2017-09-21 | Discharge: 2017-09-21 | Disposition: A | Discharge status: Home / Self Care | Payer: Self-pay | Attending: Emergency Medicine | Admitting: Emergency Medicine

## 2017-09-21 ENCOUNTER — Encounter: Payer: Self-pay | Admitting: Emergency Medicine

## 2017-09-21 DIAGNOSIS — R0981 Nasal congestion: Secondary | ICD-10-CM | POA: Insufficient documentation

## 2017-09-21 DIAGNOSIS — J029 Acute pharyngitis, unspecified: Secondary | ICD-10-CM | POA: Insufficient documentation

## 2017-09-21 DIAGNOSIS — M25561 Pain in right knee: Secondary | ICD-10-CM | POA: Insufficient documentation

## 2017-09-21 LAB — GROUP A STREP BY PCR: Group A Strep by PCR: NOT DETECTED

## 2017-09-21 MED ORDER — MELOXICAM 7.5 MG PO TABS: 7.5000 mg | ORAL_TABLET | Freq: Every day | ORAL | 0 refills | Status: DC

## 2017-09-21 MED ORDER — PSEUDOEPH-BROMPHEN-DM 30-2-10 MG/5ML PO SYRP: 5.0000 mL | ORAL_SOLUTION | Freq: Four times a day (QID) | ORAL | 0 refills | Status: DC | PRN

## 2017-09-21 MED ORDER — LIDOCAINE VISCOUS 2 % MT SOLN: 5.0000 mL | Freq: Four times a day (QID) | OROMUCOSAL | 0 refills | Status: DC | PRN

## 2017-09-21 NOTE — ED Provider Notes (Signed)
[facility] Emergency Department Provider Note   ____________________________________________   First MD Initiated Contact with Patient 09/21/17 0809     (approximate)  I have reviewed the triage vital signs and the nursing notes.   HISTORY  Chief Complaint Sore Throat; Nasal Congestion; and Knee Pain    HPI Kimberly French is a 62 y.o. female patient complained of sore throat and nasal congestion for 4 days.  Patient also state increased pain in right knee.  Patient states no relief taking Aleve and sinus medications.  Patient has a history of osteoarthritis of bilateral knees.  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  Patient rates her pain as a 10/10.  Patient described pain is "aching".  No palliative measures for sore throat.   History reviewed. No pertinent past medical history.  There are no active problems to display for this patient.   Past Surgical History:  Procedure Laterality Date  . ABDOMINAL HYSTERECTOMY    . TUBAL LIGATION      Prior to Admission medications   Medication Sig Start Date End Date Taking? Authorizing Provider  albuterol (PROVENTIL HFA;VENTOLIN HFA) 108 (90 Base) MCG/ACT inhaler Inhale 2 puffs into the lungs every 6 (six) hours as needed for wheezing or shortness of breath. 06/14/17   Enid Derry, PA-C  azithromycin (ZITHROMAX Z-PAK) 250 MG tablet Take 2 tablets (500 mg) on  Day 1,  followed by 1 tablet (250 mg) once daily on Days 2 through 5. 06/14/17   Enid Derry, PA-C  baclofen (LIORESAL) 10 MG tablet Take 0.5 tablets (5 mg total) by mouth daily. 11/06/16 11/06/17  Enid Derry, PA-C  benzonatate (TESSALON PERLES) 100 MG capsule Take 1 capsule (100 mg total) by mouth 3 (three) times daily as needed for cough. 06/14/17 06/14/18  Enid Derry, PA-C  brompheniramine-pseudoephedrine-DM 30-2-10 MG/5ML syrup Take 5 mLs by mouth 4 (four) times daily as needed. Mix with  5 mL of viscous lidocaine for swish and swallow. 09/21/17   Joni Reining, PA-C  dicyclomine (BENTYL) 20 MG tablet Take 1 tablet (20 mg total) by mouth 3 (three) times daily as needed for spasms. 09/16/15 09/15/16  Leona Carry, MD  lidocaine (XYLOCAINE) 2 % solution Use as directed 5 mLs in the mouth or throat every 6 (six) hours as needed for mouth pain. Mix with 5 mL of Bromfed-DM for swish and swallow. 09/21/17   Joni Reining, PA-C  meloxicam (MOBIC) 7.5 MG tablet Take 1 tablet (7.5 mg total) by mouth daily. 09/21/17   Joni Reining, PA-C  naproxen (NAPROSYN) 500 MG tablet Take 1 tablet (500 mg total) by mouth 2 (two) times daily with a meal. 10/20/16 10/20/17  Enid Derry, PA-C  ondansetron (ZOFRAN ODT) 4 MG disintegrating tablet Take 1 tablet (4 mg total) by mouth every 8 (eight) hours as needed for nausea or vomiting. 09/16/15   Leona Carry, MD  predniSONE (DELTASONE) 10 MG tablet Take 6 tablets on day 1, take 5 tablets on day 2, take 4 tablets on day 3, take 3 tablets on day 4, take 2 tablets on day 5, take 1 tablet on day 6 06/14/17   Enid Derry, PA-C    Allergies Patient has no known allergies.  No family history on file.  Social History Social History   Tobacco Use  . Smoking status: Never Smoker  . Smokeless tobacco: Never Used  Substance Use Topics  . Alcohol use:  No  . Drug use: No    Review of Systems Constitutional: No fever/chills Eyes: No visual changes. ENT: Sore throat Cardiovascular: Denies chest pain. Respiratory: Denies shortness of breath. Gastrointestinal: No abdominal pain.  No nausea, no vomiting.  No diarrhea.  No constipation. Genitourinary: Negative for dysuria. Musculoskeletal: Right knee pain.   Skin: Negative for rash. Neurological: Negative for headaches, focal weakness or numbness.   ____________________________________________   PHYSICAL EXAM:  VITAL SIGNS: ED Triage Vitals  Enc Vitals Group     BP 09/21/17 0751 126/80      Pulse Rate 09/21/17 0751 92     Resp 09/21/17 0751 17     Temp 09/21/17 0751 99 F (37.2 C)     Temp Source 09/21/17 0751 Oral     SpO2 09/21/17 0751 98 %     Weight 09/21/17 0752 150 lb (68 kg)     Height 09/21/17 0752 5\' 1"  (1.549 m)     Head Circumference --      Peak Flow --      Pain Score 09/21/17 0752 10     Pain Loc --      Pain Edu? --      Excl. in [place]? --    Constitutional: Alert and oriented. Well appearing and in no acute distress. Nose: Edematous nasal turbinates clear rhinorrhea. Mouth/Throat: Mucous membranes are moist.  Oropharynx non-erythematous. Neck: No stridor. Hematological/Lymphatic/Immunilogical: No cervical lymphadenopathy. Cardiovascular: Normal rate, regular rhythm. Grossly normal heart sounds.  Good peripheral circulation. Respiratory: Normal respiratory effort.  No retractions. Lungs CTAB. Musculoskeletal: No obvious deformity to the right knee.  Patient has full neck range of motion.  Moderate crepitus palpation anterior patella. Neurologic:  Normal speech and language. No gross focal neurologic deficits are appreciated. No gait instability. Skin:  Skin is warm, dry and intact. No rash noted. Psychiatric: Mood and affect are normal. Speech and behavior are normal.  ____________________________________________   LABS (all labs ordered are listed, but only abnormal results are displayed)  Labs Reviewed  GROUP A STREP BY PCR   ____________________________________________  EKG   ____________________________________________  RADIOLOGY  ED MD interpretation:    Official radiology report(s): No results found.  ____________________________________________   PROCEDURES  Procedure(s) performed: None  Procedures  Critical Care performed: No  ____________________________________________   INITIAL IMPRESSION / ASSESSMENT AND PLAN / ED COURSE  As part of my medical decision making, I reviewed the following data within the electronic  MEDICAL RECORD NUMBER    Patient presents with sore throat and nasal congestion for 4 days.  Patient also complained of increasing right knee pain secondary to arthritis.  Patient strep test was negative.  Patient given discharge care instruction advised take medication as directed.  Patient advised to follow-up PCP for continued care.      ____________________________________________   FINAL CLINICAL IMPRESSION(S) / ED DIAGNOSES  Final diagnoses:  Pharyngitis, unspecified etiology  Acute pain of right knee     ED Discharge Orders        Ordered    lidocaine (XYLOCAINE) 2 % solution  Every 6 hours PRN     09/21/17 0946    brompheniramine-pseudoephedrine-DM 30-2-10 MG/5ML syrup  4 times daily PRN     09/21/17 0946    meloxicam (MOBIC) 7.5 MG tablet  Daily     09/21/17 0946       Note:  This document was prepared using Dragon voice recognition software and may include unintentional dictation errors.    Nona DellSmith, Ronald  K, PA-C 09/21/17 1610    Arnaldo Natal, MD 09/21/17 1432

## 2017-09-21 NOTE — ED Triage Notes (Signed)
Pt reports sore throat x4 days, nasal congestion and pain in right knee. Pt has been taking aleve and sinus medication without relief.

## 2017-09-21 NOTE — ED Notes (Signed)
Pt with red, sore throat. S/s of URI. NAD

## 2017-12-28 ENCOUNTER — Emergency Department
Admission: EM | Admit: 2017-12-28 | Discharge: 2017-12-28 | Disposition: A | Discharge status: Home / Self Care | Payer: Self-pay | Attending: Emergency Medicine | Admitting: Emergency Medicine

## 2017-12-28 ENCOUNTER — Emergency Department: Payer: Self-pay

## 2017-12-28 ENCOUNTER — Encounter: Payer: Self-pay | Admitting: Emergency Medicine

## 2017-12-28 DIAGNOSIS — M170 Bilateral primary osteoarthritis of knee: Secondary | ICD-10-CM | POA: Insufficient documentation

## 2017-12-28 DIAGNOSIS — Z79899 Other long term (current) drug therapy: Secondary | ICD-10-CM | POA: Insufficient documentation

## 2017-12-28 MED ORDER — NAPROXEN 500 MG PO TABS: 500.0000 mg | ORAL_TABLET | Freq: Two times a day (BID) | ORAL | Status: DC

## 2017-12-28 NOTE — ED Notes (Signed)
See triage note  Presents with pain to left knee  Denies any injury  Min swelling note to knee  Ambulates well to treatment room

## 2017-12-28 NOTE — ED Provider Notes (Signed)
[facility] Emergency Department Provider Note   ____________________________________________   None    (approximate)  I have reviewed the triage vital signs and the nursing notes.   HISTORY  Chief Complaint Knee Pain    HPI Kimberly French is a 62 y.o. female patient complain of bilateral knee pain left greater than right.  Patient state pain for over 2 years.  Patient state she has been told was arthritis but no definitive diagnosis.  Patient state refractory to over-the-counter anti-inflammatory medications.  Patient rates the pain a 7/10.  Patient described the pain is "achy".  History reviewed. No pertinent past medical history.  There are no active problems to display for this patient.   Past Surgical History:  Procedure Laterality Date  . ABDOMINAL HYSTERECTOMY    . TUBAL LIGATION      Prior to Admission medications   Medication Sig Start Date End Date Taking? Authorizing Provider  albuterol (PROVENTIL HFA;VENTOLIN HFA) 108 (90 Base) MCG/ACT inhaler Inhale 2 puffs into the lungs every 6 (six) hours as needed for wheezing or shortness of breath. 06/14/17   Enid Derry, PA-C  azithromycin (ZITHROMAX Z-PAK) 250 MG tablet Take 2 tablets (500 mg) on  Day 1,  followed by 1 tablet (250 mg) once daily on Days 2 through 5. 06/14/17   Enid Derry, PA-C  benzonatate (TESSALON PERLES) 100 MG capsule Take 1 capsule (100 mg total) by mouth 3 (three) times daily as needed for cough. 06/14/17 06/14/18  Enid Derry, PA-C  brompheniramine-pseudoephedrine-DM 30-2-10 MG/5ML syrup Take 5 mLs by mouth 4 (four) times daily as needed. Mix with 5 mL of viscous lidocaine for swish and swallow. 09/21/17   Joni Reining, PA-C  dicyclomine (BENTYL) 20 MG tablet Take 1 tablet (20 mg total) by mouth 3 (three) times daily as needed for spasms. 09/16/15 09/15/16  Leona Carry, MD  lidocaine (XYLOCAINE) 2 % solution Use as directed 5 mLs in the mouth or throat every 6  (six) hours as needed for mouth pain. Mix with 5 mL of Bromfed-DM for swish and swallow. 09/21/17   Joni Reining, PA-C  meloxicam (MOBIC) 7.5 MG tablet Take 1 tablet (7.5 mg total) by mouth daily. 09/21/17   Joni Reining, PA-C  naproxen (NAPROSYN) 500 MG tablet Take 1 tablet (500 mg total) by mouth 2 (two) times daily with a meal. 12/28/17   Joni Reining, PA-C  ondansetron (ZOFRAN ODT) 4 MG disintegrating tablet Take 1 tablet (4 mg total) by mouth every 8 (eight) hours as needed for nausea or vomiting. 09/16/15   Leona Carry, MD  predniSONE (DELTASONE) 10 MG tablet Take 6 tablets on day 1, take 5 tablets on day 2, take 4 tablets on day 3, take 3 tablets on day 4, take 2 tablets on day 5, take 1 tablet on day 6 06/14/17   Enid Derry, PA-C    Allergies Patient has no known allergies.  No family history on file.  Social History Social History   Tobacco Use  . Smoking status: Never Smoker  . Smokeless tobacco: Never Used  Substance Use Topics  . Alcohol use: No  . Drug use: No    Review of Systems  Constitutional: No fever/chills Eyes: No visual changes. ENT: No sore throat. Cardiovascular: Denies chest pain. Respiratory: Denies shortness of breath. Gastrointestinal: No abdominal pain.  No nausea, no vomiting.  No diarrhea.  No constipation. Genitourinary: Negative for dysuria. Musculoskeletal: Chronic bilateral knee pain. Skin:  Negative for rash. Neurological: Negative for headaches, focal weakness or numbness.   ____________________________________________   PHYSICAL EXAM:  VITAL SIGNS: ED Triage Vitals  Enc Vitals Group     BP 12/28/17 1118 (!) 129/92     Pulse Rate 12/28/17 1118 95     Resp 12/28/17 1118 16     Temp 12/28/17 1118 98.4 F (36.9 C)     Temp Source 12/28/17 1118 Oral     SpO2 12/28/17 1118 99 %     Weight 12/28/17 1122 150 lb (68 kg)     Height 12/28/17 1122 5\' 1"  (1.549 m)     Head Circumference --      Peak Flow --      Pain Score  12/28/17 1121 7     Pain Loc --      Pain Edu? --      Excl. in [place]? --     Constitutional: Alert and oriented. Well appearing and in no acute distress. Cardiovascular: Normal rate, regular rhythm. Grossly normal heart sounds.  Good peripheral circulation. Respiratory: Normal respiratory effort.  No retractions. Lungs CTAB. Musculoskeletal: No obvious deformity of bilateral knees.  Moderate to palpation of the left knee.  Patient has full/ equal range of motion.  No obvious joint effusion. Neurologic:  Normal speech and language. No gross focal neurologic deficits are appreciated. No gait instability. Skin:  Skin is warm, dry and intact. No rash noted. Psychiatric: Mood and affect are normal. Speech and behavior are normal.  ____________________________________________   LABS (all labs ordered are listed, but only abnormal results are displayed)  Labs Reviewed - No data to display ____________________________________________  EKG   ____________________________________________  RADIOLOGY  ED MD interpretation:    Official radiology report(s): Dg Knee Complete 4 Views Left  Result Date: 12/28/2017 CLINICAL DATA:  The knee pain for while. EXAM: LEFT KNEE - COMPLETE 4+ VIEW COMPARISON:  None in PACs FINDINGS: The bones are subjectively adequately mineralized. There is mild narrowing of the medial joint compartment. There is faint chondrocalcinosis of the medial meniscus. The lateral and patellofemoral joint spaces are well maintained. There is mild beaking of the tibial spines. There is no acute or healing fracture. There is no joint effusion. IMPRESSION: Mild osteoarthritic joint space loss and meniscal calcification of the medial compartment. No acute bony abnormality. Electronically Signed   By: David  SwazilandJordan M.D.   On: 12/28/2017 12:09    ____________________________________________   PROCEDURES  Procedure(s) performed: None  Procedures  Critical Care performed:  No  ____________________________________________   INITIAL IMPRESSION / ASSESSMENT AND PLAN / ED COURSE  As part of my medical decision making, I reviewed the following data within the electronic MEDICAL RECORD NUMBER    Left knee pain secondary to osteoarthritis.  Discussed x-ray findings with patient.  Patient given discharge care instruction.  Patient advised to follow orthopedic for definitive evaluation and treatment.      ____________________________________________   FINAL CLINICAL IMPRESSION(S) / ED DIAGNOSES  Final diagnoses:  Primary osteoarthritis of both knees     ED Discharge Orders        Ordered    naproxen (NAPROSYN) 500 MG tablet  2 times daily with meals     12/28/17 1228       Note:  This document was prepared using Dragon voice recognition software and may include unintentional dictation errors.    Joni ReiningSmith, Michaeljohn Biss K, PA-C 12/28/17 1230    Schaevitz, Myra Rudeavid Matthew, MD 12/28/17 1524

## 2017-12-28 NOTE — ED Triage Notes (Signed)
Pt ambulates with a steady gait to triage room. Pt arrives with complaints of knee pain. Pt states "I've had problems with me knees for awhile, ive been here and they said it was arthritis but I don't think it's arthritis." Pt denies any relief with OTC medication. Pt denies any new injury.

## 2017-12-28 NOTE — Discharge Instructions (Signed)
Call and schedule appointment with orthopedic clinic for definitive evaluation and treatment.

## 2019-06-27 ENCOUNTER — Other Ambulatory Visit: Payer: Self-pay | Admitting: Certified Nurse Midwife

## 2019-06-27 DIAGNOSIS — Z1231 Encounter for screening mammogram for malignant neoplasm of breast: Secondary | ICD-10-CM

## 2019-07-11 ENCOUNTER — Other Ambulatory Visit: Payer: Self-pay

## 2019-07-11 ENCOUNTER — Emergency Department
Admission: EM | Admit: 2019-07-11 | Discharge: 2019-07-11 | Disposition: A | Discharge status: Home / Self Care | Payer: No Typology Code available for payment source | Attending: Emergency Medicine | Admitting: Emergency Medicine

## 2019-07-11 DIAGNOSIS — T464X5A Adverse effect of angiotensin-converting-enzyme inhibitors, initial encounter: Secondary | ICD-10-CM | POA: Diagnosis not present

## 2019-07-11 DIAGNOSIS — R220 Localized swelling, mass and lump, head: Secondary | ICD-10-CM | POA: Insufficient documentation

## 2019-07-11 DIAGNOSIS — I10 Essential (primary) hypertension: Secondary | ICD-10-CM | POA: Diagnosis not present

## 2019-07-11 DIAGNOSIS — T783XXA Angioneurotic edema, initial encounter: Secondary | ICD-10-CM

## 2019-07-11 HISTORY — DX: Essential (primary) hypertension: I10

## 2019-07-11 MED ORDER — EPINEPHRINE 0.3 MG/0.3ML IJ SOAJ
INTRAMUSCULAR | Status: DC
Start: 2019-07-11 — End: 2019-07-12
  Filled 2019-07-11: qty 0.3

## 2019-07-11 MED ORDER — METHYLPREDNISOLONE SODIUM SUCC 125 MG IJ SOLR
125.0000 mg | Freq: Once | INTRAMUSCULAR | Status: AC
Start: 2019-07-11 — End: 2019-07-11
  Administered 2019-07-11: 125 mg via INTRAVENOUS
  Filled 2019-07-11: qty 2

## 2019-07-11 MED ORDER — EPINEPHRINE 0.3 MG/0.3ML IJ SOAJ: 0.3000 mg | INTRAMUSCULAR | 0 refills | Status: AC | PRN

## 2019-07-11 MED ORDER — DIPHENHYDRAMINE HCL 50 MG/ML IJ SOLN
25.0000 mg | Freq: Once | INTRAMUSCULAR | Status: AC
  Administered 2019-07-11: 25 mg via INTRAVENOUS
  Filled 2019-07-11: qty 1

## 2019-07-11 MED ORDER — FAMOTIDINE IN NACL 20-0.9 MG/50ML-% IV SOLN
20.0000 mg | Freq: Once | INTRAVENOUS | Status: AC
  Administered 2019-07-11: 20 mg via INTRAVENOUS
  Filled 2019-07-11: qty 50

## 2019-07-11 MED ORDER — FAMOTIDINE 20 MG PO TABS: 20.0000 mg | ORAL_TABLET | Freq: Two times a day (BID) | ORAL | 0 refills | Status: AC

## 2019-07-11 MED ORDER — PREDNISONE 20 MG PO TABS: 20.0000 mg | ORAL_TABLET | Freq: Every day | ORAL | 0 refills | Status: AC

## 2019-07-11 MED ORDER — DIPHENHYDRAMINE HCL 50 MG/ML IJ SOLN: 25.0000 mg | Freq: Once | INTRAMUSCULAR | Status: DC

## 2019-07-11 MED ORDER — DIPHENHYDRAMINE HCL 50 MG PO TABS: ORAL_TABLET | ORAL | 0 refills | Status: AC

## 2019-07-11 NOTE — Discharge Instructions (Signed)
Stop taking the benazepril immediately.  You should tell all doctors that you are allergic to this medication and all ACE inhibitors.  For your swelling, I have prescribed 3 days of prednisone, a steroid, as well as Benadryl and Pepcid.  The Benadryl and Pepcid can be bought over-the-counter as well.  The swelling should improve over the next 24 to 48 hours.  If you notice the swelling to worsen, as well as any new or worsening swelling in your tongue, throat, or sensation like you are having difficulty breathing, return to the ER immediately.  I have also prescribed an EpiPen.  While your reaction today is technically not an allergic reaction, given that you have had some lip swelling, it is a good idea to keep this on you and to give if you develop worsening oral swelling or other signs of severe anaphylaxis or allergic reaction, as indicated on the EpiPen.

## 2019-07-11 NOTE — ED Provider Notes (Signed)
[facility] Emergency Department Provider Note  ____________________________________________   First MD Initiated Contact with Patient 07/11/19 1911     (approximate)  I have reviewed the triage vital signs and the nursing notes.   HISTORY  Chief Complaint  Angioedema    HPI Kimberly French is a 64 y.o. female  Here with lip swelling. Pt states that earlier today, she noticed that her right upper lip felt a little numb or "odd." She was eating at the time and thought it was related to that or something getting on her lip. Since then, she's had progressively worsening R upper lip edema and swelling. It progressed fairly rapidly but has been somewhat constant over past few hr. No tongue swelling. No fullness sensation in her throat or neck. No difficulty speaking, swallowing, or breathing. No other complaints. No urticaria. No new allergen exposures. No new food exposures or lipstick/cosmetics.  Of note, pt recently restarted Benazepril several weeks ago.        Past Medical History:  Diagnosis Date  . Hypertension     There are no problems to display for this patient.   Past Surgical History:  Procedure Laterality Date  . ABDOMINAL HYSTERECTOMY    . TUBAL LIGATION      Prior to Admission medications   Medication Sig Start Date End Date Taking? Authorizing Provider  albuterol (PROVENTIL HFA;VENTOLIN HFA) 108 (90 Base) MCG/ACT inhaler Inhale 2 puffs into the lungs every 6 (six) hours as needed for wheezing or shortness of breath. 06/14/17   Enid Derry, PA-C  diphenhydrAMINE (BENADRYL) 50 MG tablet Take 1 tablet every 6 hours for 24 hours, then every 6 hours PRN for itching or swelling of lips 07/11/19   Shaune Pollack, MD  EPINEPHrine 0.3 mg/0.3 mL IJ SOAJ injection Inject 0.3 mLs (0.3 mg total) into the muscle as needed for anaphylaxis. 07/11/19   Shaune Pollack, MD  famotidine (PEPCID) 20 MG tablet Take 1 tablet (20 mg total) by mouth 2 (two)  times daily for 3 days. 07/11/19 07/14/19  Shaune Pollack, MD  predniSONE (DELTASONE) 20 MG tablet Take 1 tablet (20 mg total) by mouth daily for 3 days. 07/11/19 07/14/19  Shaune Pollack, MD    Allergies Benazepril  No family history on file.  Social History Social History   Tobacco Use  . Smoking status: Never Smoker  . Smokeless tobacco: Never Used  Substance Use Topics  . Alcohol use: No  . Drug use: No    Review of Systems  Review of Systems  Constitutional: Negative for chills and fever.  HENT: Positive for facial swelling. Negative for congestion, drooling, rhinorrhea, sinus pain, sore throat, trouble swallowing and voice change.   Respiratory: Negative for shortness of breath.   Cardiovascular: Negative for chest pain.  Gastrointestinal: Negative for abdominal pain.  Genitourinary: Negative for flank pain.  Musculoskeletal: Negative for neck pain.  Skin: Negative for rash and wound.  Allergic/Immunologic: Negative for immunocompromised state.  Neurological: Negative for weakness and numbness.  Hematological: Does not bruise/bleed easily.     ____________________________________________  PHYSICAL EXAM:      VITAL SIGNS: ED Triage Vitals  Enc Vitals Group     BP 07/11/19 1914 (!) 145/81     Pulse Rate 07/11/19 1914 71     Resp 07/11/19 1914 18     Temp 07/11/19 1914 99.2 F (37.3 C)     Temp Source 07/11/19 1914 Oral     SpO2 07/11/19 1914 100 %  Weight 07/11/19 1915 156 lb (70.8 kg)     Height 07/11/19 1915 5\' 1"  (1.549 m)     Head Circumference --      Peak Flow --      Pain Score 07/11/19 1915 0     Pain Loc --      Pain Edu? --      Excl. in [place]? --      Physical Exam Vitals and nursing note reviewed.  Constitutional:      General: She is not in acute distress.    Appearance: She is well-developed.  HENT:     Head: Normocephalic and atraumatic.     Comments: Significant edema of right upper lip extending just across midline. No open wounds.  No tongue/lingual swelling, posterior OP clear. No submandibular or sublingual swelling. Eyes:     Conjunctiva/sclera: Conjunctivae normal.  Neck:     Comments: No stridor Cardiovascular:     Rate and Rhythm: Normal rate and regular rhythm.     Heart sounds: Normal heart sounds.  Pulmonary:     Effort: Pulmonary effort is normal. No respiratory distress.     Breath sounds: No wheezing.  Abdominal:     General: There is no distension.  Musculoskeletal:     Cervical back: Neck supple.  Skin:    General: Skin is warm.     Capillary Refill: Capillary refill takes less than 2 seconds.     Findings: No rash.     Comments: No uriticaria  Neurological:     Mental Status: She is alert and oriented to person, place, and time.     Motor: No abnormal muscle tone.       ____________________________________________   LABS (all labs ordered are listed, but only abnormal results are displayed)  Labs Reviewed - No data to display  ____________________________________________  EKG: Normal sinus rhythm, VR 65. QRS 95, QTc 399. RSR', no acute ST elevations or depressions. No acute ischemia or infarct. ________________________________________  RADIOLOGY All imaging, including plain films, CT scans, and ultrasounds, independently reviewed by me, and interpretations confirmed via formal radiology reads.  ED MD interpretation:   None  Official radiology report(s): No results found.  ____________________________________________  PROCEDURES   Procedure(s) performed (including Critical Care):  Procedures  ____________________________________________  INITIAL IMPRESSION / MDM / [place] / ED COURSE  As part of my medical decision making, I reviewed the following data within the [place] notes reviewed and incorporated, Old chart reviewed, Notes from prior ED visits, and [place] Controlled Substance Database       *Kimberly French was  evaluated in Emergency Department on 07/12/2019 for the symptoms described in the history of present illness. She was evaluated in the context of the global COVID-19 pandemic, which necessitated consideration that the patient might be at risk for infection with the SARS-CoV-2 virus that causes COVID-19. Institutional protocols and algorithms that pertain to the evaluation of patients at risk for COVID-19 are in a state of rapid change based on information released by regulatory bodies including the CDC and federal and state organizations. These policies and algorithms were followed during the patient's care in the ED.  Some ED evaluations and interventions may be delayed as a result of limited staffing during the pandemic.*  Clinical Course as of Jul 12 39  Mon Jul 11, 2019  2154 64 yo F here with likely ACE induced angioedema. Monitored x >4 hr with no progression of edema/swelling, and she  has no tongue, posterior pharyngeal, tonsillar, uvular, or signs of hypopharyngeal or epiglottic/airway involvement. This began after starting Benazepril and she has no urticaria, GI sx, or other signs of allergic sx with no known recent triggers. Will treat with steroids and antihistamines in event of allergic rxn, have her stop benazepril (added to Allergy list) and call her PCP tomorrow to discuss new BP meds.   [CI]    Clinical Course User Index [CI] Shaune Pollack, MD    Medical Decision Making:  As above.  ____________________________________________  FINAL CLINICAL IMPRESSION(S) / ED DIAGNOSES  Final diagnoses:  Angioedema due to angiotensin converting enzyme inhibitor (ACE-I)     MEDICATIONS GIVEN DURING THIS VISIT:  Medications  EPINEPHrine (EPI-PEN) 0.3 mg/0.3 mL injection (has no administration in time range)  famotidine (PEPCID) IVPB 20 mg premix (0 mg Intravenous Stopped 07/11/19 2002)  methylPREDNISolone sodium succinate (SOLU-MEDROL) 125 mg/2 mL injection 125 mg (125 mg Intravenous Given  07/11/19 1918)  diphenhydrAMINE (BENADRYL) injection 25 mg (25 mg Intravenous Given 07/11/19 2328)     ED Discharge Orders         Ordered    predniSONE (DELTASONE) 20 MG tablet  Daily     07/11/19 2211    diphenhydrAMINE (BENADRYL) 50 MG tablet     07/11/19 2211    famotidine (PEPCID) 20 MG tablet  2 times daily     07/11/19 2211    EPINEPHrine 0.3 mg/0.3 mL IJ SOAJ injection  As needed     07/11/19 2211           Note:  This document was prepared using Dragon voice recognition software and may include unintentional dictation errors.   Shaune Pollack, MD 07/12/19 [phone]

## 2019-07-11 NOTE — ED Triage Notes (Signed)
PT to ED via EMS c/o angioedema since this AM.  No trouble breathing, lung fields clear per EMS. PT took 2 benadryls prior to EMS arrival. AO and speaking in full sentences

## 2019-07-11 NOTE — ED Notes (Signed)
Pt talking to family on phone  

## 2019-07-11 NOTE — ED Notes (Signed)
Pt up to bathroom with assistance.  No resp distress.  Pt using phone to call family

## 2019-07-11 NOTE — ED Notes (Signed)
Dr Erma Heritage in with pt now.  Pt alert.  No acute resp distress.

## 2019-07-11 NOTE — ED Notes (Signed)
Pt's right upper lip still swollen   No resp distress.  Pt alert.

## 2020-06-27 ENCOUNTER — Other Ambulatory Visit: Payer: Self-pay | Admitting: Certified Nurse Midwife

## 2020-06-27 DIAGNOSIS — Z1231 Encounter for screening mammogram for malignant neoplasm of breast: Secondary | ICD-10-CM

## 2020-07-18 ENCOUNTER — Other Ambulatory Visit: Payer: Self-pay

## 2020-07-18 ENCOUNTER — Ambulatory Visit
Admission: RE | Admit: 2020-07-18 | Discharge: 2020-07-18 | Disposition: A | Discharge status: Home / Self Care | Payer: Medicare HMO | Source: Ambulatory Visit | Attending: Certified Nurse Midwife | Admitting: Certified Nurse Midwife

## 2020-07-18 DIAGNOSIS — Z1231 Encounter for screening mammogram for malignant neoplasm of breast: Secondary | ICD-10-CM | POA: Insufficient documentation

## 2020-07-25 DIAGNOSIS — M25562 Pain in left knee: Secondary | ICD-10-CM | POA: Diagnosis not present

## 2020-07-25 DIAGNOSIS — Z23 Encounter for immunization: Secondary | ICD-10-CM | POA: Diagnosis not present

## 2020-07-25 DIAGNOSIS — Z0000 Encounter for general adult medical examination without abnormal findings: Secondary | ICD-10-CM | POA: Diagnosis not present

## 2020-07-25 DIAGNOSIS — E119 Type 2 diabetes mellitus without complications: Secondary | ICD-10-CM | POA: Diagnosis not present

## 2020-07-25 DIAGNOSIS — I10 Essential (primary) hypertension: Secondary | ICD-10-CM | POA: Diagnosis not present

## 2020-07-25 DIAGNOSIS — Z780 Asymptomatic menopausal state: Secondary | ICD-10-CM | POA: Diagnosis not present

## 2020-08-03 DIAGNOSIS — M8588 Other specified disorders of bone density and structure, other site: Secondary | ICD-10-CM | POA: Diagnosis not present

## 2020-08-14 DIAGNOSIS — H9202 Otalgia, left ear: Secondary | ICD-10-CM | POA: Diagnosis not present

## 2020-08-14 DIAGNOSIS — R0981 Nasal congestion: Secondary | ICD-10-CM | POA: Diagnosis not present

## 2020-08-14 DIAGNOSIS — Z20822 Contact with and (suspected) exposure to covid-19: Secondary | ICD-10-CM | POA: Diagnosis not present

## 2020-08-20 DIAGNOSIS — R197 Diarrhea, unspecified: Secondary | ICD-10-CM | POA: Diagnosis not present

## 2020-08-23 ENCOUNTER — Emergency Department: Payer: Medicare HMO

## 2020-08-23 ENCOUNTER — Emergency Department
Admission: EM | Admit: 2020-08-23 | Discharge: 2020-08-23 | Disposition: A | Discharge status: Home / Self Care | Payer: Medicare HMO | Attending: Emergency Medicine | Admitting: Emergency Medicine

## 2020-08-23 ENCOUNTER — Other Ambulatory Visit: Payer: Self-pay

## 2020-08-23 DIAGNOSIS — M10061 Idiopathic gout, right knee: Secondary | ICD-10-CM | POA: Insufficient documentation

## 2020-08-23 DIAGNOSIS — M25562 Pain in left knee: Secondary | ICD-10-CM | POA: Diagnosis not present

## 2020-08-23 DIAGNOSIS — I10 Essential (primary) hypertension: Secondary | ICD-10-CM | POA: Diagnosis not present

## 2020-08-23 DIAGNOSIS — E876 Hypokalemia: Secondary | ICD-10-CM | POA: Insufficient documentation

## 2020-08-23 DIAGNOSIS — M1711 Unilateral primary osteoarthritis, right knee: Secondary | ICD-10-CM | POA: Diagnosis not present

## 2020-08-23 DIAGNOSIS — M25561 Pain in right knee: Secondary | ICD-10-CM | POA: Diagnosis not present

## 2020-08-23 LAB — CBC WITH DIFFERENTIAL/PLATELET
Abs Immature Granulocytes: 0.05 10*3/uL (ref 0.00–0.07)
Basophils Absolute: 0.1 10*3/uL (ref 0.0–0.1)
Basophils Relative: 1 %
Eosinophils Absolute: 0.1 10*3/uL (ref 0.0–0.5)
Eosinophils Relative: 1 %
HCT: 41.6 % (ref 36.0–46.0)
Hemoglobin: 13.9 g/dL (ref 12.0–15.0)
Immature Granulocytes: 0 %
Lymphocytes Relative: 14 %
Lymphs Abs: 1.8 10*3/uL (ref 0.7–4.0)
MCH: 29.4 pg (ref 26.0–34.0)
MCHC: 33.4 g/dL (ref 30.0–36.0)
MCV: 87.9 fL (ref 80.0–100.0)
Monocytes Absolute: 1.3 10*3/uL — ABNORMAL HIGH (ref 0.1–1.0)
Monocytes Relative: 11 %
Neutro Abs: 9.2 10*3/uL — ABNORMAL HIGH (ref 1.7–7.7)
Neutrophils Relative %: 73 %
Platelets: 183 10*3/uL (ref 150–400)
RBC: 4.73 MIL/uL (ref 3.87–5.11)
RDW: 13.4 % (ref 11.5–15.5)
WBC: 12.5 10*3/uL — ABNORMAL HIGH (ref 4.0–10.5)
nRBC: 0 % (ref 0.0–0.2)

## 2020-08-23 LAB — URIC ACID: Uric Acid, Serum: 8.8 mg/dL — ABNORMAL HIGH (ref 2.5–7.1)

## 2020-08-23 LAB — BASIC METABOLIC PANEL
Anion gap: 11 (ref 5–15)
BUN: 21 mg/dL (ref 8–23)
CO2: 25 mmol/L (ref 22–32)
Calcium: 9.7 mg/dL (ref 8.9–10.3)
Chloride: 102 mmol/L (ref 98–111)
Creatinine, Ser: 0.78 mg/dL (ref 0.44–1.00)
GFR, Estimated: >60 mL/min (ref >60)
Glucose, Bld: 106 mg/dL — ABNORMAL HIGH (ref 70–99)
Potassium: 3 mmol/L — ABNORMAL LOW (ref 3.5–5.1)
Sodium: 138 mmol/L (ref 135–145)

## 2020-08-23 MED ORDER — COLCHICINE 0.6 MG PO TABS: 0.6000 mg | ORAL_TABLET | Freq: Every day | ORAL | 0 refills | Status: AC

## 2020-08-23 MED ORDER — POTASSIUM CHLORIDE ER 10 MEQ PO TBCR: 10.0000 meq | EXTENDED_RELEASE_TABLET | Freq: Every day | ORAL | 0 refills | Status: AC

## 2020-08-23 MED ORDER — LIDOCAINE 5 % EX PTCH
1.0000 | MEDICATED_PATCH | CUTANEOUS | Status: DC
  Administered 2020-08-23: 1 via TRANSDERMAL
  Filled 2020-08-23: qty 1

## 2020-08-23 MED ORDER — METHYLPREDNISOLONE 4 MG PO TBPK: ORAL_TABLET | ORAL | 0 refills | Status: AC

## 2020-08-23 MED ORDER — KETOROLAC TROMETHAMINE 30 MG/ML IJ SOLN
30.0000 mg | Freq: Once | INTRAMUSCULAR | Status: AC
  Administered 2020-08-23: 30 mg via INTRAMUSCULAR
  Filled 2020-08-23: qty 1

## 2020-08-23 NOTE — Discharge Instructions (Signed)
Read and follow discharge care instruction.  Follow-up PCP in 7 to 10 days.

## 2020-08-23 NOTE — ED Provider Notes (Signed)
[facility] Emergency Department Provider Note  . ____________________________________________   Event Date/Time   First MD Initiated Contact with Patient 08/23/20 1001     (approximate)  I have reviewed the triage vital signs and the nursing notes.   HISTORY  Chief Complaint Knee Pain  .  HPI Kimberly French is a 65 y.o. female Patient present for right knee pain and edema. States history of bilateral knee pain, but this feels different.No provocative incident for compliant. Pain increased with ambulation. Rates as 5/10. Described pain as "achy". No palliative measure for compliant.        . Past Medical History:  Diagnosis Date  . Hypertension     There are no problems to display for this patient.   Past Surgical History:  Procedure Laterality Date  . ABDOMINAL HYSTERECTOMY    . TUBAL LIGATION      Prior to Admission medications   Medication Sig Start Date End Date Taking? Authorizing Provider  colchicine 0.6 MG tablet Take 1 tablet (0.6 mg total) by mouth daily. 08/23/20 08/23/21 Yes Joni Reining, PA-C  methylPREDNISolone (MEDROL DOSEPAK) 4 MG TBPK tablet Take Tapered dose as directed 08/23/20  Yes Joni Reining, PA-C  potassium chloride (KLOR-CON) 10 MEQ tablet Take 1 tablet (10 mEq total) by mouth daily. 08/23/20  Yes Joni Reining, PA-C  albuterol (PROVENTIL HFA;VENTOLIN HFA) 108 (90 Base) MCG/ACT inhaler Inhale 2 puffs into the lungs every 6 (six) hours as needed for wheezing or shortness of breath. 06/14/17   Enid Derry, PA-C  diphenhydrAMINE (BENADRYL) 50 MG tablet Take 1 tablet every 6 hours for 24 hours, then every 6 hours PRN for itching or swelling of lips 07/11/19   Shaune Pollack, MD  EPINEPHrine 0.3 mg/0.3 mL IJ SOAJ injection Inject 0.3 mLs (0.3 mg total) into the muscle as needed for anaphylaxis. 07/11/19   Shaune Pollack, MD  famotidine (PEPCID) 20 MG tablet Take 1 tablet (20 mg total) by mouth 2 (two) times daily  for 3 days. 07/11/19 07/14/19  Shaune Pollack, MD    Allergies Benazepril  Family History  Problem Relation Age of Onset  . Breast cancer Mother   . Breast cancer Maternal Grandmother   . Breast cancer Cousin   . Breast cancer Cousin     Social History Social History   Tobacco Use  . Smoking status: Never Smoker  . Smokeless tobacco: Never Used  Vaping Use  . Vaping Use: Never used  Substance Use Topics  . Alcohol use: No  . Drug use: No    Review of Systems  Constitutional: No fever/chills Eyes: No visual changes. ENT: No sore throat. Cardiovascular: Denies chest pain. Respiratory: Denies shortness of breath. Gastrointestinal: No abdominal pain.  No nausea, no vomiting.  No diarrhea.  No constipation. Genitourinary: Negative for dysuria. Musculoskeletal: Chronic knee pain. Skin: Negative for rash. Neurological: Negative for headaches, focal weakness or numbness. Endocrine: Hypertension.:  Allergic/Immunilogical: Benazepril  ____________________________________________   PHYSICAL EXAM:  VITAL SIGNS: ED Triage Vitals  Enc Vitals Group     BP 08/23/20 0915 121/73     Pulse Rate 08/23/20 0915 91     Resp 08/23/20 0915 18     Temp 08/23/20 0915 99 F (37.2 C)     Temp Source 08/23/20 0915 Oral     SpO2 08/23/20 0915 99 %     Weight 08/23/20 0915 160 lb (72.6 kg)     Height 08/23/20 0915 5\' 1"  (1.549 m)  Head Circumference --      Peak Flow --      Pain Score 08/23/20 0914 10     Pain Loc --      Pain Edu? --      Excl. in GC? --    Constitutional: Alert and oriented. Well appearing and in no acute distress. Immunilogical: No cervical lymphadenopathy. Cardiovascular: Normal rate, regular rhythm. Grossly normal heart sounds.  Good peripheral circulation. Respiratory: Normal respiratory effort.  No retractions. Lungs CTAB. Genitourinary: Deferred Musculoskeletal: No obvious deformity. Moderate edema. Mild crepitus with palpation. Full and equal range  of motion. Neurologic:  Normal speech and language. No gross focal neurologic deficits are appreciated. No gait instability. Skin:  Skin is warm, dry and intact. No rash noted. Psychiatric: Mood and affect are normal. Speech and behavior are normal.  ____________________________________________   LABS (all labs ordered are listed, but only abnormal results are displayed)  Labs Reviewed  BASIC METABOLIC PANEL - Abnormal; Notable for the following components:      Result Value   Potassium 3.0 (*)    Glucose, Bld 106 (*)    All other components within normal limits  CBC WITH DIFFERENTIAL/PLATELET - Abnormal; Notable for the following components:   WBC 12.5 (*)    Neutro Abs 9.2 (*)    Monocytes Absolute 1.3 (*)    All other components within normal limits  URIC ACID - Abnormal; Notable for the following components:   Uric Acid, Serum 8.8 (*)    All other components within normal limits   ____________________________________________  EKG   ____________________________________________  RADIOLOGY I, Joni Reining, personally viewed and evaluated these images (plain radiographs) as part of my medical decision making, as well as reviewing the written report by the radiologist.  ED MD interpretation:  Mild degenerative changes.  Official radiology report(s): No results found.  ____________________________________________   PROCEDURES  Procedure(s) performed (including Critical Care):  Procedures   ____________________________________________   INITIAL IMPRESSION / ASSESSMENT AND PLAN / ED COURSE  As part of my medical decision making, I reviewed the following data within the electronic MEDICAL RECORD NUMBER Notes from prior ED visits and [place] Controlled Substance Database        Patient present with increase right knee pain.  Discussed lab and x-ray results with patient.  Compliant and physical exam consistence with gout and hypokalemia. Patient given discharge care  instructions and advised to take medications as direct.  Follow up with PCP.      ____________________________________________   FINAL CLINICAL IMPRESSION(S) / ED DIAGNOSES  Final diagnoses:  Acute idiopathic gout of right knee  Hypokalemia  Primary osteoarthritis of right knee     ED Discharge Orders         Ordered    potassium chloride (KLOR-CON) 10 MEQ tablet  Daily        08/23/20 1324    colchicine 0.6 MG tablet  Daily        08/23/20 1324    methylPREDNISolone (MEDROL DOSEPAK) 4 MG TBPK tablet        08/23/20 1324          *Please note:  Kimberly French was evaluated in Emergency Department on 08/24/2020 for the symptoms described in the history of present illness. She was evaluated in the context of the global COVID-19 pandemic, which necessitated consideration that the patient might be at risk for infection with the SARS-CoV-2 virus that causes COVID-19. Institutional protocols and algorithms that pertain to the  evaluation of patients at risk for COVID-19 are in a state of rapid change based on information released by regulatory bodies including the CDC and federal and state organizations. These policies and algorithms were followed during the patient's care in the ED.  Some ED evaluations and interventions may be delayed as a result of limited staffing during and the pandemic.*   Note:  This document was prepared using Dragon voice recognition software and may include unintentional dictation errors.    Concha Se, MD 08/25/20 [phone]

## 2020-08-23 NOTE — ED Triage Notes (Signed)
Pt comes into the ED via EMS from home with c/o right knee pain, denies injury,. States she has issues with BL knee with hx of arthritis.

## 2020-08-23 NOTE — ED Notes (Signed)
See triage note  Presents with right knee pain  States pain became worse yesterday  Has used heating pad  No meds  Denies any injury  Min swelling noted

## 2020-09-04 DIAGNOSIS — D72829 Elevated white blood cell count, unspecified: Secondary | ICD-10-CM | POA: Diagnosis not present

## 2020-09-04 DIAGNOSIS — R197 Diarrhea, unspecified: Secondary | ICD-10-CM | POA: Diagnosis not present

## 2020-12-12 ENCOUNTER — Emergency Department: Payer: Medicare Other

## 2020-12-12 ENCOUNTER — Encounter: Payer: Self-pay | Admitting: *Deleted

## 2020-12-12 ENCOUNTER — Emergency Department
Admission: EM | Admit: 2020-12-12 | Discharge: 2020-12-12 | Disposition: A | Discharge status: Home / Self Care | Payer: Medicare Other | Attending: Emergency Medicine | Admitting: Emergency Medicine

## 2020-12-12 ENCOUNTER — Other Ambulatory Visit: Payer: Self-pay

## 2020-12-12 DIAGNOSIS — R110 Nausea: Secondary | ICD-10-CM | POA: Diagnosis not present

## 2020-12-12 DIAGNOSIS — R1012 Left upper quadrant pain: Secondary | ICD-10-CM | POA: Diagnosis not present

## 2020-12-12 DIAGNOSIS — R1084 Generalized abdominal pain: Secondary | ICD-10-CM

## 2020-12-12 DIAGNOSIS — M549 Dorsalgia, unspecified: Secondary | ICD-10-CM | POA: Insufficient documentation

## 2020-12-12 DIAGNOSIS — I10 Essential (primary) hypertension: Secondary | ICD-10-CM | POA: Diagnosis not present

## 2020-12-12 LAB — COMPREHENSIVE METABOLIC PANEL
ALT: 20 U/L (ref 0–44)
AST: 29 U/L (ref 15–41)
Albumin: 4.2 g/dL (ref 3.5–5.0)
Alkaline Phosphatase: 59 U/L (ref 38–126)
Anion gap: 7 (ref 5–15)
BUN: 14 mg/dL (ref 8–23)
CO2: 24 mmol/L (ref 22–32)
Calcium: 9.8 mg/dL (ref 8.9–10.3)
Chloride: 107 mmol/L (ref 98–111)
Creatinine, Ser: 0.72 mg/dL (ref 0.44–1.00)
GFR, Estimated: >60 mL/min (ref >60)
Glucose, Bld: 129 mg/dL — ABNORMAL HIGH (ref 70–99)
Potassium: 3.4 mmol/L — ABNORMAL LOW (ref 3.5–5.1)
Sodium: 138 mmol/L (ref 135–145)
Total Bilirubin: 0.8 mg/dL (ref 0.3–1.2)
Total Protein: 8.1 g/dL (ref 6.5–8.1)

## 2020-12-12 LAB — URINALYSIS, COMPLETE (UACMP) WITH MICROSCOPIC
Bacteria, UA: NONE SEEN
Bilirubin Urine: NEGATIVE
Glucose, UA: NEGATIVE mg/dL
Ketones, ur: NEGATIVE mg/dL
Nitrite: NEGATIVE
Protein, ur: NEGATIVE mg/dL
Specific Gravity, Urine: 1.017 (ref 1.005–1.030)
pH: 7 (ref 5.0–8.0)

## 2020-12-12 LAB — LIPASE, BLOOD: Lipase: 30 U/L (ref 11–51)

## 2020-12-12 LAB — CBC
HCT: 41.8 % (ref 36.0–46.0)
Hemoglobin: 13.6 g/dL (ref 12.0–15.0)
MCH: 30.6 pg (ref 26.0–34.0)
MCHC: 32.5 g/dL (ref 30.0–36.0)
MCV: 93.9 fL (ref 80.0–100.0)
Platelets: 157 10*3/uL (ref 150–400)
RBC: 4.45 MIL/uL (ref 3.87–5.11)
RDW: 13.4 % (ref 11.5–15.5)
WBC: 8.4 10*3/uL (ref 4.0–10.5)
nRBC: 0 % (ref 0.0–0.2)

## 2020-12-12 MED ORDER — ONDANSETRON HCL 4 MG/2ML IJ SOLN
4.0000 mg | Freq: Once | INTRAMUSCULAR | Status: AC
  Administered 2020-12-12: 4 mg via INTRAVENOUS
  Filled 2020-12-12: qty 2

## 2020-12-12 MED ORDER — FENTANYL CITRATE (PF) 100 MCG/2ML IJ SOLN
50.0000 ug | Freq: Once | INTRAMUSCULAR | Status: AC
  Administered 2020-12-12: 50 ug via INTRAVENOUS
  Filled 2020-12-12: qty 2

## 2020-12-12 MED ORDER — SODIUM CHLORIDE 0.9 % IV BOLUS
1000.0000 mL | Freq: Once | INTRAVENOUS | Status: AC
  Administered 2020-12-12: 1000 mL via INTRAVENOUS

## 2020-12-12 NOTE — ED Triage Notes (Signed)
Pt arrives via [facility] from [facility], c/o 10/10 abdominal pain since around 0200 with persistent diarrhea. Just started abx (flagyl and fluconazole) for UTI. En route Hr 72, 113/84.

## 2020-12-12 NOTE — ED Triage Notes (Signed)
Pt reports midline abdominal pain that woke her up from sleep, nausea. Diarrhea that started en route to ED. Denies fevers. Has had UTI symptoms for about 2 weeks, doctor just changed her abx yesterday.

## 2020-12-12 NOTE — ED Provider Notes (Signed)
[facility] Emergency Department Provider Note  Time seen: 7:35 AM  I have reviewed the triage vital signs and the nursing notes.   HISTORY  Chief Complaint Abdominal Pain   HPI Kimberly French is a 65 y.o. female with a past medical history of hypertension presents to the emergency department for abdominal pain.  According to the patient she was recently diagnosed with urinary tract infection and her antibiotics were changed yesterday to now on antibiotic and antifungal.  Patient states she awoke this morning with sharp pain to her left upper quadrant wrapping around to her left back.  States she was very nauseated this morning due to the pain.  Patient denies any fever.  Denies any diarrhea.  Does state intermittent dysuria over the past week or 2.   Past Medical History:  Diagnosis Date   Hypertension     There are no problems to display for this patient.   Past Surgical History:  Procedure Laterality Date   ABDOMINAL HYSTERECTOMY     TUBAL LIGATION      Prior to Admission medications   Medication Sig Start Date End Date Taking? Authorizing Provider  albuterol (PROVENTIL HFA;VENTOLIN HFA) 108 (90 Base) MCG/ACT inhaler Inhale 2 puffs into the lungs every 6 (six) hours as needed for wheezing or shortness of breath. 06/14/17   Enid Derry, PA-C  colchicine 0.6 MG tablet Take 1 tablet (0.6 mg total) by mouth daily. 08/23/20 08/23/21  Joni Reining, PA-C  diphenhydrAMINE (BENADRYL) 50 MG tablet Take 1 tablet every 6 hours for 24 hours, then every 6 hours PRN for itching or swelling of lips 07/11/19   Shaune Pollack, MD  EPINEPHrine 0.3 mg/0.3 mL IJ SOAJ injection Inject 0.3 mLs (0.3 mg total) into the muscle as needed for anaphylaxis. 07/11/19   Shaune Pollack, MD  famotidine (PEPCID) 20 MG tablet Take 1 tablet (20 mg total) by mouth 2 (two) times daily for 3 days. 07/11/19 07/14/19  Shaune Pollack, MD  methylPREDNISolone (MEDROL DOSEPAK) 4 MG TBPK tablet  Take Tapered dose as directed 08/23/20   Joni Reining, PA-C  potassium chloride (KLOR-CON) 10 MEQ tablet Take 1 tablet (10 mEq total) by mouth daily. 08/23/20   Joni Reining, PA-C    Allergies  Allergen Reactions   Benazepril Swelling    Angioedema    Family History  Problem Relation Age of Onset   Breast cancer Mother    Breast cancer Maternal Grandmother    Breast cancer Cousin    Breast cancer Cousin     Social History Social History   Tobacco Use   Smoking status: Never   Smokeless tobacco: Never  Vaping Use   Vaping Use: Never used  Substance Use Topics   Alcohol use: No   Drug use: No    Review of Systems Constitutional: Negative for fever. Cardiovascular: Negative for chest pain. Respiratory: Negative for shortness of breath. Gastrointestinal: Left-sided abdominal pain.  Positive for nausea. Genitourinary: Intermittent dysuria Musculoskeletal: Negative for musculoskeletal complaints Neurological: Negative for headache All other ROS negative  ____________________________________________   PHYSICAL EXAM:  VITAL SIGNS: ED Triage Vitals  Enc Vitals Group     BP 12/12/20 0301 (!) 148/72     Pulse Rate 12/12/20 0301 (!) 59     Resp 12/12/20 0301 16     Temp 12/12/20 0301 98.4 F (36.9 C)     Temp Source 12/12/20 0301 Oral     SpO2 12/12/20 0301 100 %  Weight 12/12/20 0302 159 lb (72.1 kg)     Height 12/12/20 0302 5\' 1"  (1.549 m)     Head Circumference --      Peak Flow --      Pain Score 12/12/20 0302 10     Pain Loc --      Pain Edu? --      Excl. in GC? --    Constitutional: Alert and oriented. Well appearing and in no distress. Eyes: Normal exam ENT      Head: Normocephalic and atraumatic.      Mouth/Throat: Mucous membranes are moist. Cardiovascular: Normal rate, regular rhythm.  Respiratory: Normal respiratory effort without tachypnea nor retractions. Breath sounds are clear Gastrointestinal: Soft, mild tenderness along the left mid  abdomen and left upper quadrant.  No rebound guarding or distention Musculoskeletal: Nontender with normal range of motion in all extremities. Neurologic:  Normal speech and language. No gross focal neurologic deficits Skin:  Skin is warm, dry and intact.  Psychiatric: Mood and affect are normal.   ____________________________________________   RADIOLOGY  IMPRESSION:  1. No urinary calculus or obstructive uropathy.  2. Extensive diverticulosis of the large bowel from the splenic  flexure through the sigmoid. But no active inflammation identified.  3. No acute or inflammatory process elsewhere in the noncontrast  abdomen or pelvis. Normal appendix.  4. Aortic Atherosclerosis (ICD10-I70.0).  ____________________________________________   INITIAL IMPRESSION / ASSESSMENT AND PLAN / ED COURSE  Pertinent labs & imaging results that were available during my care of the patient were reviewed by me and considered in my medical decision making (see chart for details).   Patient presents to the emergency department for left flank pain starting early this morning around 2 or 3 AM.  States it was a 10/10 sharp severe pain but is now dissipating somewhat states moderate discomfort currently.  Minimal tenderness on examination.  Lab work is reassuring does have calcium oxalate crystals in her urine we will proceed with a CT scan to rule out ureterolithiasis.  We will treat pain nausea and IV hydrate with awaiting results.  Patient agreeable to plan of care.  CT scan is negative for acute abnormality.  Patient's work-up overall reassuring.  We will discharge patient home with continued PCP follow-up.  Patient agreeable plan of care.  Kimberly French was evaluated in Emergency Department on 12/12/2020 for the symptoms described in the history of present illness. She was evaluated in the context of the global COVID-19 pandemic, which necessitated consideration that the patient might be at risk for  infection with the SARS-CoV-2 virus that causes COVID-19. Institutional protocols and algorithms that pertain to the evaluation of patients at risk for COVID-19 are in a state of rapid change based on information released by regulatory bodies including the CDC and federal and state organizations. These policies and algorithms were followed during the patient's care in the ED.  ____________________________________________   FINAL CLINICAL IMPRESSION(S) / ED DIAGNOSES  Left flank pain   12/14/2020, MD 12/12/20 [phone]

## 2021-07-04 IMAGING — CR DG KNEE COMPLETE 4+V*R*
4 series · 4 of 4 positions shown · non-contrast
Comparison: None

CLINICAL DATA: RIGHT knee pain worsened since yesterday, history
arthritis, no history of trauma,

EXAM:
RIGHT KNEE - COMPLETE 4+ VIEW

[knee ap]
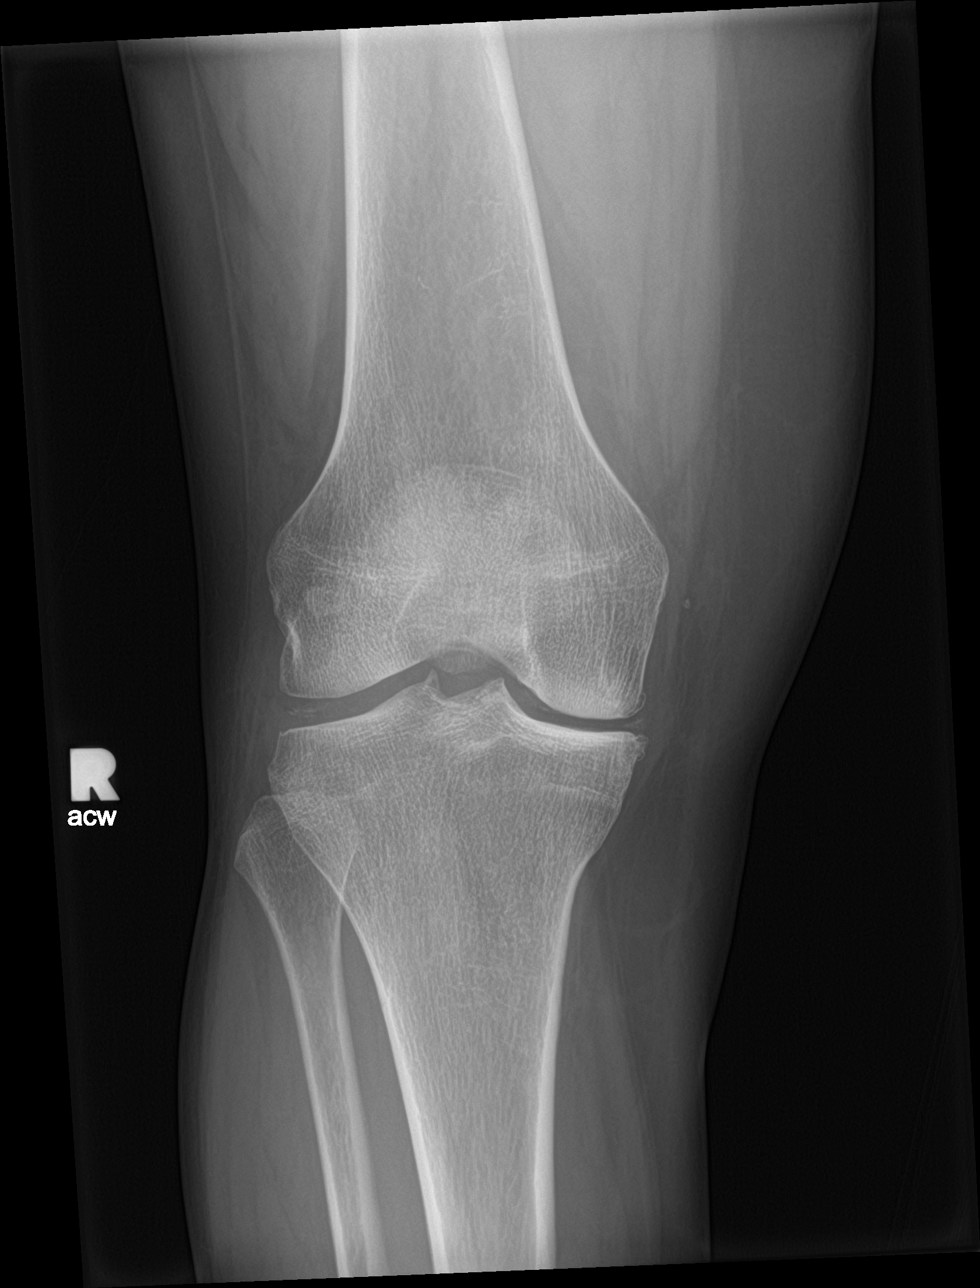

[knee obl (1 of 2)]
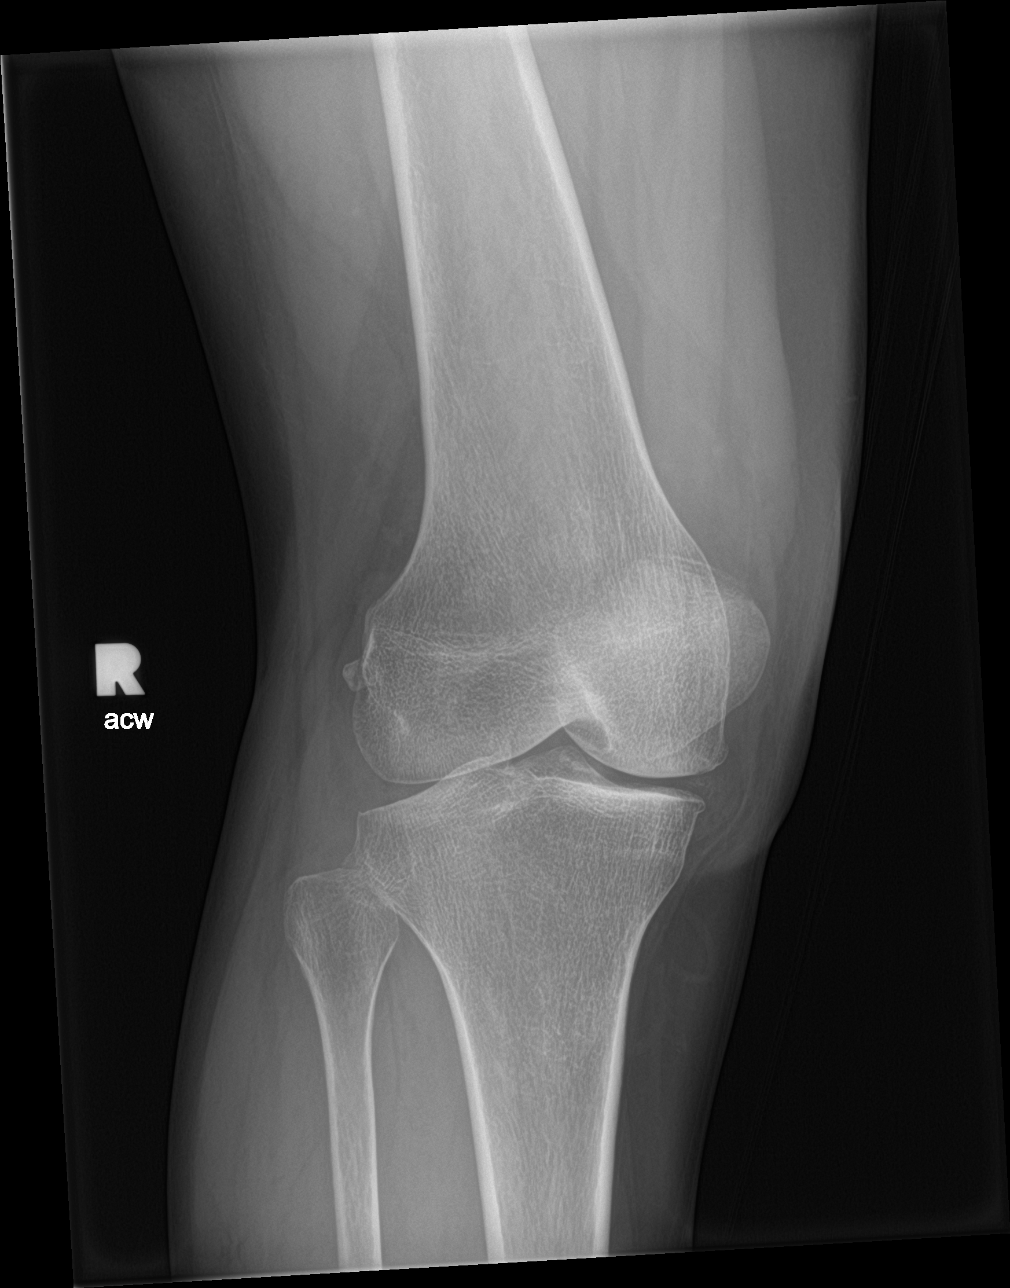

[knee obl (2 of 2)]
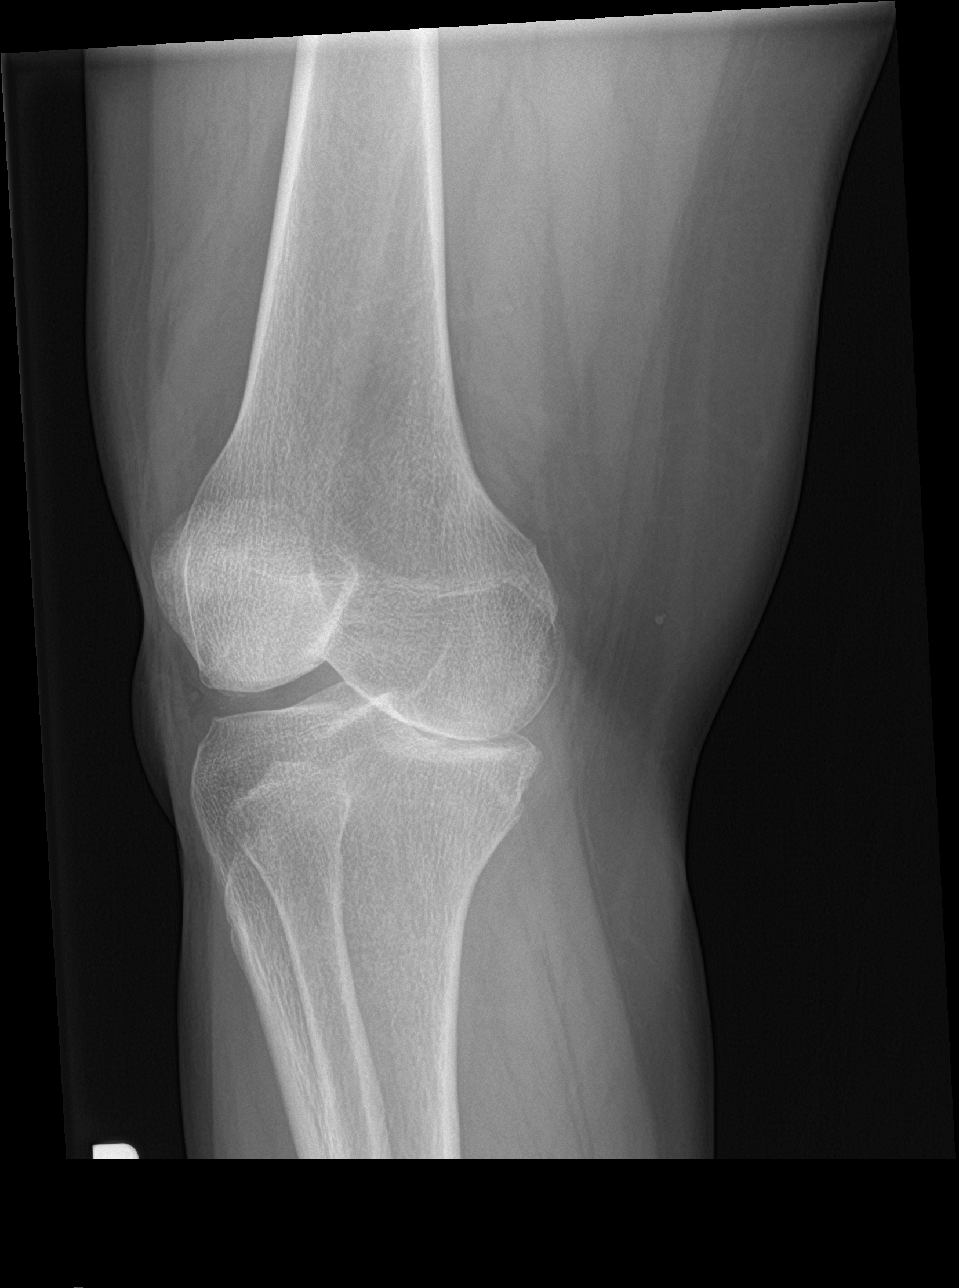

[knee lat]
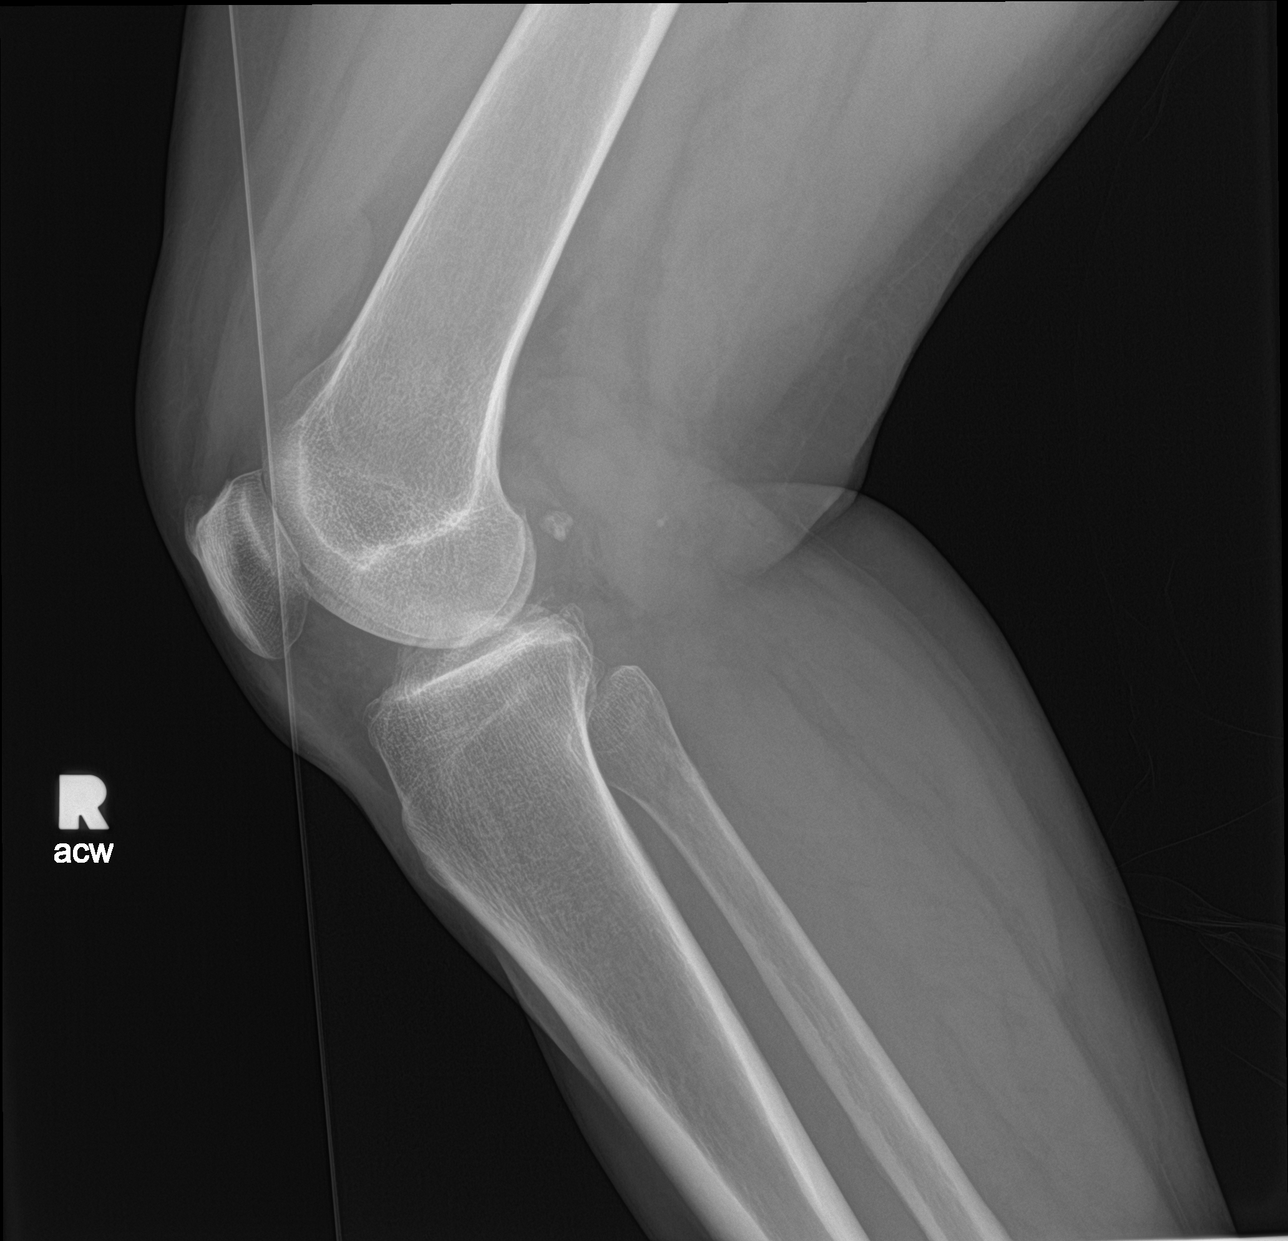

[4 of 4 positions shown; findings below may reference images not displayed]

FINDINGS: Mild osseous demineralization.

Joint space narrowing greatest at medial compartment.

Chondrocalcinosis question CPPD.

No fracture, dislocation, or bone destruction.

Small joint effusion present.
IMPRESSION: Mild degenerative changes and question CPPD RIGHT knee with
associated small knee joint effusion.

## 2021-09-26 DIAGNOSIS — M542 Cervicalgia: Secondary | ICD-10-CM | POA: Diagnosis not present

## 2021-10-04 DIAGNOSIS — R6884 Jaw pain: Secondary | ICD-10-CM | POA: Diagnosis not present

## 2021-10-04 DIAGNOSIS — K047 Periapical abscess without sinus: Secondary | ICD-10-CM | POA: Diagnosis not present

## 2021-10-22 DIAGNOSIS — R6889 Other general symptoms and signs: Secondary | ICD-10-CM | POA: Diagnosis not present

## 2021-10-23 IMAGING — CT CT RENAL STONE PROTOCOL
2 of 4 series · 16 of 46 positions shown, 18 images · non-contrast
Comparison: CT Abdomen and Pelvis 11/06/2016 and earlier.

CLINICAL DATA: 65-year-old female with midline abdominal pain that
woke her from sleep. Nausea. Diarrhea. Left side flank pain.

EXAM:
CT ABDOMEN AND PELVIS WITHOUT CONTRAST
TECHNIQUE: Multidetector CT imaging of the abdomen and pelvis was performed
following the standard protocol without IV contrast.

[Series 2: stone full standard · axial · 0.71mm/px · z∈[-451,-36]mm · 13 of 91 slices shown, 15 images]
[im 4/91  soft-tissue]
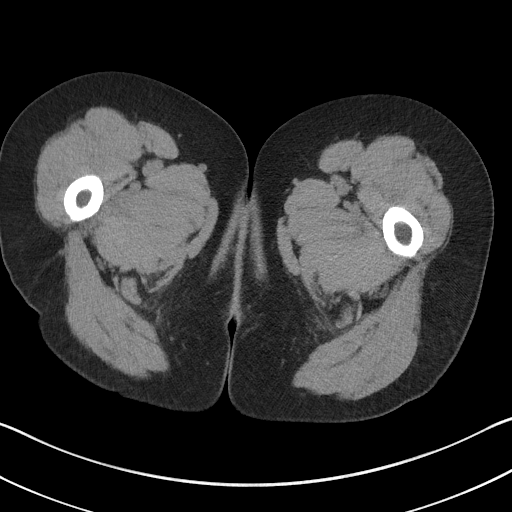
[im 4/91  bone]
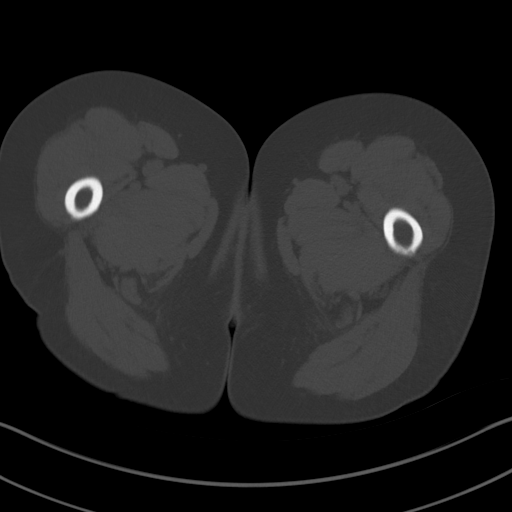
[im 12/91  soft-tissue]
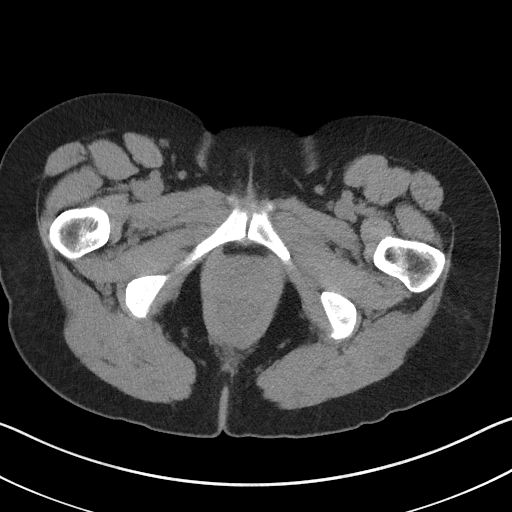
[im 20/91  soft-tissue]
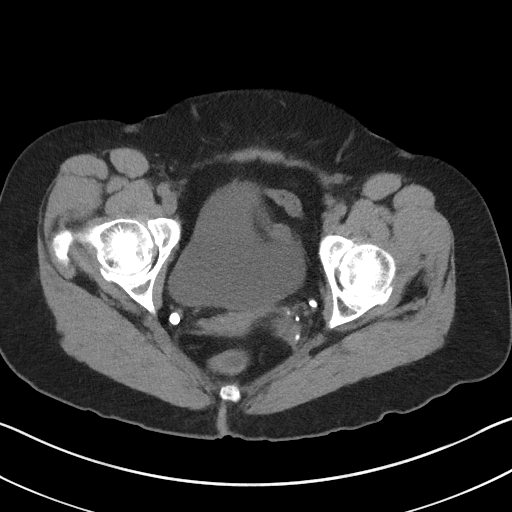
[im 24/91  soft-tissue]
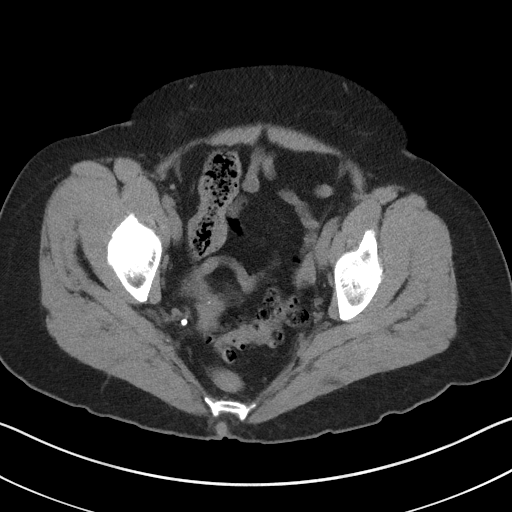
[im 32/91  soft-tissue]
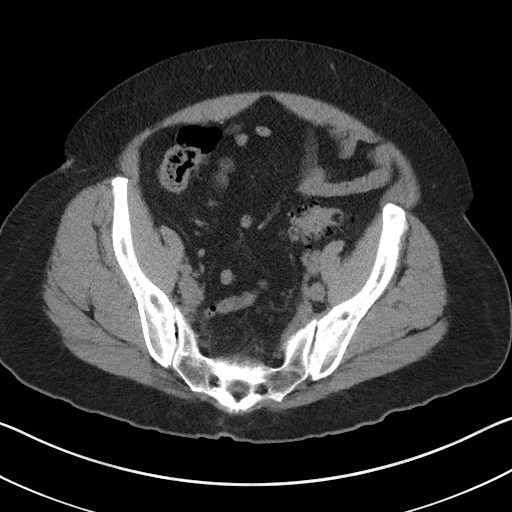
[im 40/91  soft-tissue]
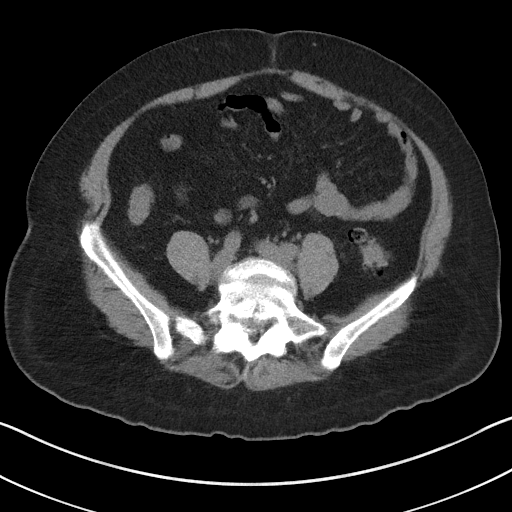
[im 47/91  soft-tissue]
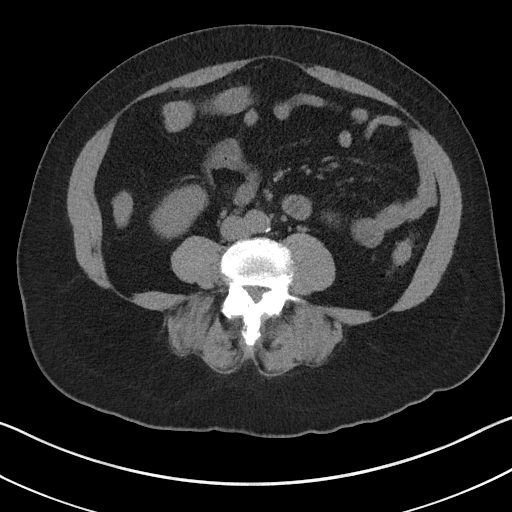
[im 51/91  soft-tissue]
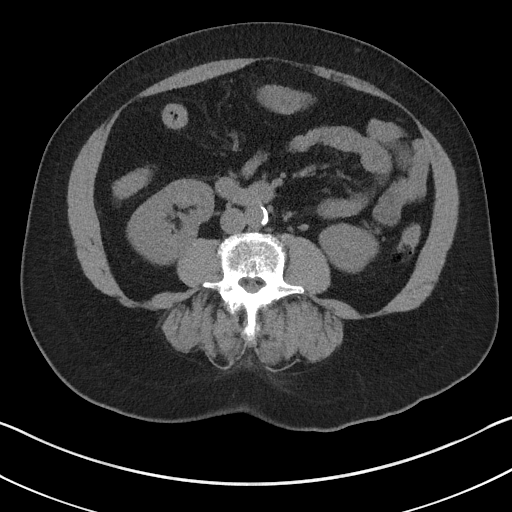
[im 59/91  soft-tissue]
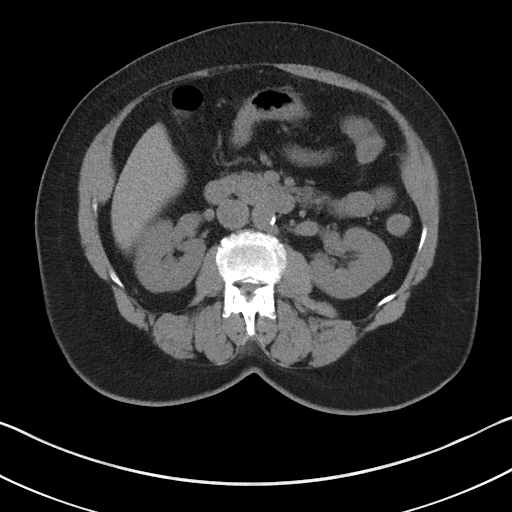
[im 59/91  bone]
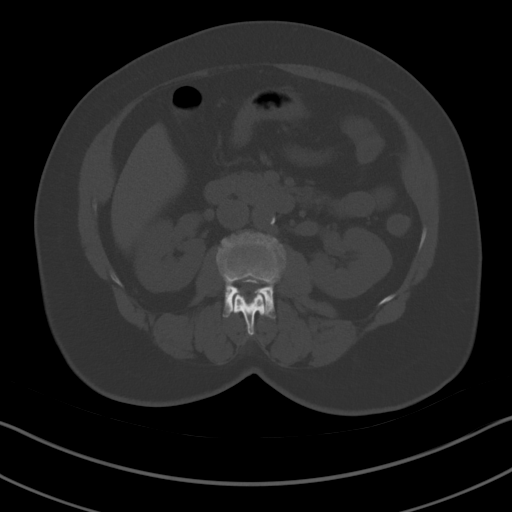
[im 67/91  soft-tissue]
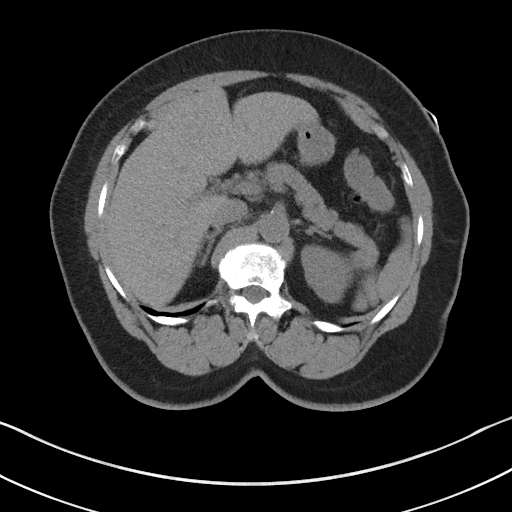
[im 71/91  soft-tissue]
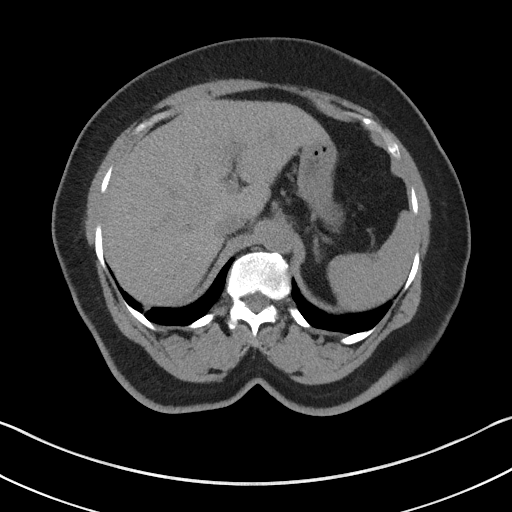
[im 79/91  soft-tissue]
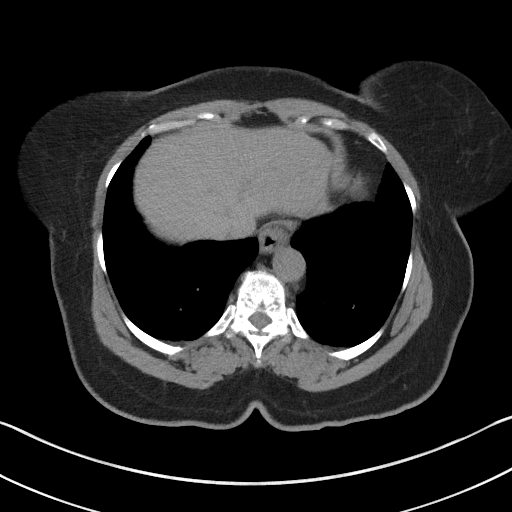
[im 87/91  soft-tissue]
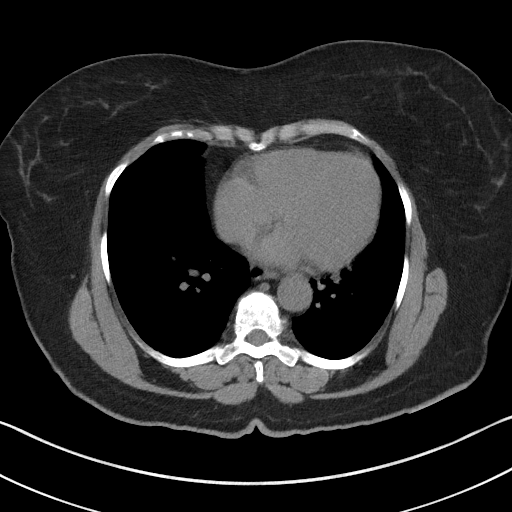

[Series 3: coronal · coronal · 0.73mm/px · 3 of 156 slices shown]
[im 52/156  soft-tissue]
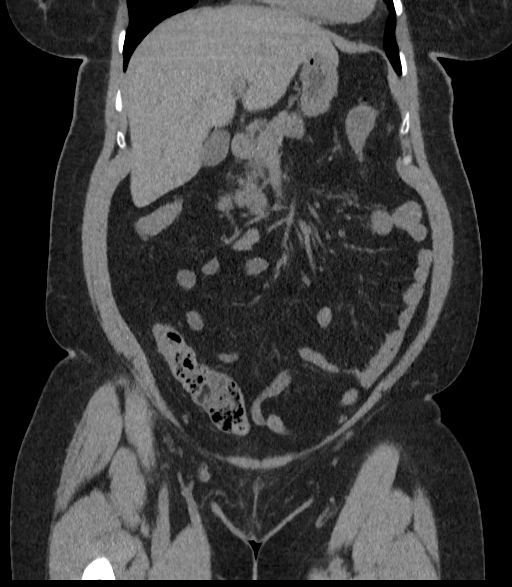
[im 69/156  soft-tissue]
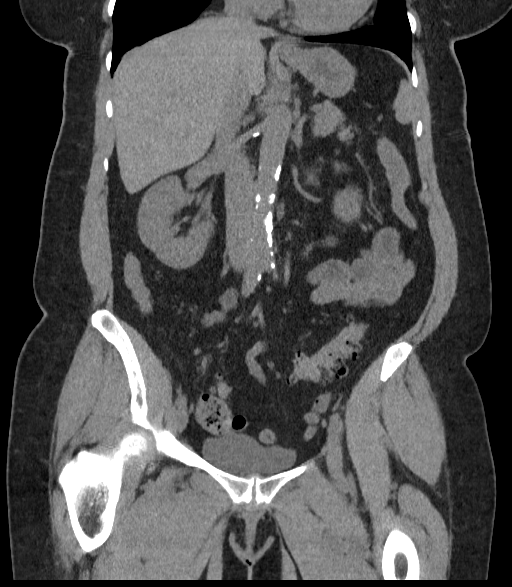
[im 87/156  soft-tissue]
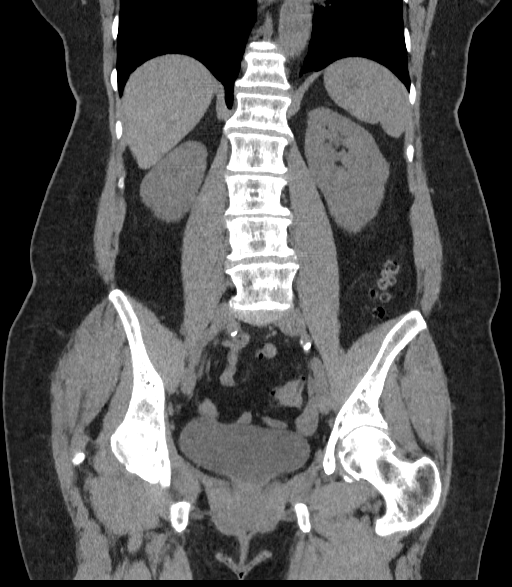

[16 of 46 positions shown; findings below may reference images not displayed]

FINDINGS: Lower chest: Stable cardiac size at the upper limits of normal.
Stable mild lung base scarring.

Hepatobiliary: Negative noncontrast liver and gallbladder.

Pancreas: Negative.

Spleen: Negative.

Adrenals/Urinary Tract: Normal adrenal glands.

Noncontrast kidneys and ureters appear stable and negative. There
are numerous pelvic phleboliths, and occasional gonadal vein
phleboliths, but no nephrolithiasis, hydronephrosis, or pararenal
stranding. Unremarkable bladder.

Stomach/Bowel: Decompressed and negative rectum. Extensive
diverticulosis from the splenic flexure throughout the sigmoid
colon. But no active inflammation identified. Right colon and
transverse colon are decompressed and appear negative. Normal
appendix visible on series 2, image 58. Decompressed and negative
terminal ileum. No dilated small bowel. Stomach and duodenum are
largely decompressed. No free air or free fluid. No mesenteric
inflammation.

Vascular/Lymphatic: Extensive Aortoiliac calcified atherosclerosis.
Normal caliber abdominal aorta. No lymphadenopathy identified.

Reproductive: Uterus appear surgically absent. Ovaries are
diminutive and within normal limits.

Other: No pelvic free fluid.

Musculoskeletal: Mild chronic spondylolisthesis at the lumbosacral
junction with interval ankylosis of chronically degenerated facets
at that level. Other lumbar disc, endplate, and facet degeneration.
No acute osseous abnormality identified.
IMPRESSION: 1. No urinary calculus or obstructive uropathy.
2. Extensive diverticulosis of the large bowel from the splenic
flexure through the sigmoid. But no active inflammation identified.
3. No acute or inflammatory process elsewhere in the noncontrast
abdomen or pelvis. Normal appendix.
4. Aortic Atherosclerosis ([id number].I).

## 2022-09-24 ENCOUNTER — Other Ambulatory Visit: Payer: Self-pay

## 2022-09-24 DIAGNOSIS — Z1231 Encounter for screening mammogram for malignant neoplasm of breast: Secondary | ICD-10-CM

## 2022-11-28 ENCOUNTER — Inpatient Hospital Stay: Admission: RE | Admit: 2022-11-28 | Payer: Self-pay | Source: Ambulatory Visit

## 2023-03-04 ENCOUNTER — Other Ambulatory Visit: Payer: Self-pay | Admitting: Physician Assistant

## 2023-03-04 DIAGNOSIS — M7989 Other specified soft tissue disorders: Secondary | ICD-10-CM

## 2023-03-04 DIAGNOSIS — N644 Mastodynia: Secondary | ICD-10-CM

## 2023-03-10 ENCOUNTER — Ambulatory Visit
Admission: RE | Admit: 2023-03-10 | Discharge: 2023-03-10 | Disposition: A | Discharge status: Home / Self Care | Payer: 59 | Source: Ambulatory Visit | Attending: Physician Assistant | Admitting: Physician Assistant

## 2023-03-10 DIAGNOSIS — M7989 Other specified soft tissue disorders: Secondary | ICD-10-CM | POA: Insufficient documentation

## 2023-03-10 DIAGNOSIS — N644 Mastodynia: Secondary | ICD-10-CM | POA: Insufficient documentation

## 2024-04-11 ENCOUNTER — Other Ambulatory Visit: Payer: Self-pay | Admitting: Internal Medicine

## 2024-04-11 DIAGNOSIS — Z1231 Encounter for screening mammogram for malignant neoplasm of breast: Secondary | ICD-10-CM

## 2024-04-18 ENCOUNTER — Encounter
